# Patient Record
Sex: Male | Born: 1965 | Race: White | Hispanic: No | Marital: Married | Smoking: Never smoker
Health system: Southern US, Community
[De-identification: ages and names within clinical notes are randomized; demographics above are authoritative.]

## PROBLEM LIST (undated history)

## (undated) DIAGNOSIS — I10 Essential (primary) hypertension: Secondary | ICD-10-CM

## (undated) DIAGNOSIS — J302 Other seasonal allergic rhinitis: Secondary | ICD-10-CM

## (undated) DIAGNOSIS — H269 Unspecified cataract: Secondary | ICD-10-CM

## (undated) DIAGNOSIS — E119 Type 2 diabetes mellitus without complications: Secondary | ICD-10-CM

## (undated) DIAGNOSIS — Z973 Presence of spectacles and contact lenses: Secondary | ICD-10-CM

## (undated) DIAGNOSIS — T7840XA Allergy, unspecified, initial encounter: Secondary | ICD-10-CM

## (undated) HISTORY — PX: COLONOSCOPY: SHX174

## (undated) HISTORY — DX: Unspecified cataract: H26.9

## (undated) HISTORY — PX: TONSILLECTOMY: SUR1361

## (undated) HISTORY — DX: Allergy, unspecified, initial encounter: T78.40XA

## (undated) HISTORY — DX: Type 2 diabetes mellitus without complications: E11.9

---

## 1998-10-25 HISTORY — PX: HERNIA REPAIR: SHX51

## 2006-10-04 ENCOUNTER — Ambulatory Visit: Payer: Self-pay | Admitting: Internal Medicine

## 2006-10-14 ENCOUNTER — Ambulatory Visit: Payer: Self-pay | Admitting: Internal Medicine

## 2006-10-14 LAB — CONVERTED CEMR LAB
ALT: 27 units/L (ref 0–40)
Alkaline Phosphatase: 66 units/L (ref 39–117)
Basophils Relative: 0.7 % (ref 0.0–1.0)
CO2: 27 meq/L (ref 19–32)
Calcium: 9.3 mg/dL (ref 8.4–10.5)
Chloride: 105 meq/L (ref 96–112)
Eosinophil percent: 1.4 % (ref 0.0–5.0)
Glomerular Filtration Rate, Af Am: 120 mL/min/{1.73_m2}
Glucose, Bld: 119 mg/dL — ABNORMAL HIGH (ref 70–99)
HCT: 41.7 % (ref 39.0–52.0)
HDL: 38.4 mg/dL — ABNORMAL LOW (ref >39.0)
Hemoglobin: 14 g/dL (ref 13.0–17.0)
Hgb A1c MFr Bld: 6.2 % — ABNORMAL HIGH (ref 4.6–6.0)
LDL Cholesterol: 122 mg/dL — ABNORMAL HIGH (ref 0–99)
Microalb Creat Ratio: 12.6 mg/g (ref 0.0–30.0)
Monocytes Absolute: 0.8 10*3/uL — ABNORMAL HIGH (ref 0.2–0.7)
Monocytes Relative: 8.4 % (ref 3.0–11.0)
RDW: 13.3 % (ref 11.5–14.6)
VLDL: 31 mg/dL (ref 0–40)
WBC: 9.3 10*3/uL (ref 4.5–10.5)

## 2006-10-27 ENCOUNTER — Ambulatory Visit: Payer: Self-pay | Admitting: Internal Medicine

## 2006-11-20 ENCOUNTER — Emergency Department (HOSPITAL_COMMUNITY): Admission: EM | Admit: 2006-11-20 | Discharge: 2006-11-20 | Payer: Self-pay | Admitting: Family Medicine

## 2007-02-15 ENCOUNTER — Ambulatory Visit: Payer: Self-pay | Admitting: Internal Medicine

## 2007-07-17 ENCOUNTER — Ambulatory Visit: Payer: Self-pay | Admitting: Internal Medicine

## 2007-07-18 ENCOUNTER — Ambulatory Visit: Payer: Self-pay | Admitting: Internal Medicine

## 2007-07-18 LAB — CONVERTED CEMR LAB
ALT: 23 units/L (ref 0–53)
BUN: 12 mg/dL (ref 6–23)
Chloride: 106 meq/L (ref 96–112)
Creatinine, Ser: 0.9 mg/dL (ref 0.4–1.5)
Creatinine,U: 204.3 mg/dL
Hgb A1c MFr Bld: 6.3 % — ABNORMAL HIGH (ref 4.6–6.0)
Microalb Creat Ratio: 13.2 mg/g (ref 0.0–30.0)
Microalb, Ur: 2.7 mg/dL — ABNORMAL HIGH (ref 0.0–1.9)
VLDL: 35 mg/dL (ref 0–40)

## 2008-01-15 DIAGNOSIS — Z8719 Personal history of other diseases of the digestive system: Secondary | ICD-10-CM | POA: Insufficient documentation

## 2008-01-15 DIAGNOSIS — J301 Allergic rhinitis due to pollen: Secondary | ICD-10-CM

## 2008-01-15 DIAGNOSIS — E119 Type 2 diabetes mellitus without complications: Secondary | ICD-10-CM

## 2008-01-15 DIAGNOSIS — Z8679 Personal history of other diseases of the circulatory system: Secondary | ICD-10-CM | POA: Insufficient documentation

## 2008-01-15 HISTORY — DX: Type 2 diabetes mellitus without complications: E11.9

## 2008-01-16 ENCOUNTER — Ambulatory Visit: Payer: Self-pay | Admitting: Internal Medicine

## 2008-01-16 DIAGNOSIS — E785 Hyperlipidemia, unspecified: Secondary | ICD-10-CM | POA: Insufficient documentation

## 2008-01-16 DIAGNOSIS — J309 Allergic rhinitis, unspecified: Secondary | ICD-10-CM | POA: Insufficient documentation

## 2008-01-16 LAB — CONVERTED CEMR LAB
Calcium: 9.3 mg/dL (ref 8.4–10.5)
Chloride: 105 meq/L (ref 96–112)
Direct LDL: 119.8 mg/dL
GFR calc Af Amer: 136 mL/min
Microalb, Ur: 2.1 mg/dL — ABNORMAL HIGH (ref 0.0–1.9)
Potassium: 4.4 meq/L (ref 3.5–5.1)
Total CHOL/HDL Ratio: 6.4
VLDL: 44 mg/dL — ABNORMAL HIGH (ref 0–40)

## 2008-01-17 ENCOUNTER — Telehealth: Payer: Self-pay | Admitting: Internal Medicine

## 2008-12-13 ENCOUNTER — Encounter: Payer: Self-pay | Admitting: Internal Medicine

## 2010-01-08 ENCOUNTER — Encounter: Payer: Self-pay | Admitting: Internal Medicine

## 2010-01-13 ENCOUNTER — Encounter: Payer: Self-pay | Admitting: Internal Medicine

## 2010-11-24 NOTE — Letter (Signed)
Summary: [facility]  [facility]   Imported By: Lanelle Bal 01/27/2010 12:58:30  _____________________________________________________________________  External Attachment:    Type:   Image     Comment:   External Document

## 2010-11-24 NOTE — Miscellaneous (Signed)
Summary: Eye Exam   Clinical Lists Changes  Observations: Added new observation of DMEYEEXAMNXT: 01/2011 (01/13/2010 9:31) Added new observation of DMEYEEXMRES: normal (01/08/2010 9:31) Added new observation of EYE EXAM BY: [facility] [phone] (01/08/2010 9:31) Added new observation of DIAB EYE EX: normal (01/08/2010 9:31)       Diabetes Management Exam:    Eye Exam:       Eye Exam done elsewhere          Date: 01/08/2010          Results: normal          Done by: [facility] [phone]

## 2011-01-14 LAB — HM DIABETES EYE EXAM

## 2011-01-18 ENCOUNTER — Other Ambulatory Visit: Payer: Self-pay | Admitting: *Deleted

## 2011-03-12 NOTE — Assessment & Plan Note (Signed)
[facility]                           PRIMARY CARE OFFICE NOTE   Timothy Lasso ANTRELL TIPLER                 MRN:          [id number]  DATE:10/04/2006                            DOB:          12-Jun-1966    CHIEF COMPLAINT:  New patient to practice.   HISTORY OF PRESENT ILLNESS:  The patient is a 45 year old white male  here to establish primary care.  The patient is originally from this  area but moved to [place] and has moved back to [place]  approximately 1 year ago.  He has been diagnosed with type 2 diabetes in  the year 2002.  At that time he states he weighed greater than 290  pounds and had very poor eating habits/diet.  Since then, the patient  has dramatically changed his lifestyle.  He was initially put on  metformin and also an antihypertensive but has been able to get off  those medications and has controlled his medications through diet alone.  His last A1c was performed a little over a year ago and he states it was  less than 6.   His other history is significant for repair of an umbilical hernia 2000,  tonsils in the late 1970s.  He did have some issues with frequent stools  that were blood tinged and a colonoscopy was performed in spring of  1999.  They did not find any abnormalities.   PAST MEDICAL HISTORY SUMMARY:  1. Type 2 diabetes.  2. History of hypertension.  3. Status post umbilical hernia repair June 2000.  4. History of rectal bleeding with normal colonoscopy in 1999.  5. Hay fever/allergies.   CURRENT MEDICATIONS:  None.   ALLERGIES TO MEDICATIONS:  None known.   SOCIAL HISTORY:  The patient is married, has two children ages 40 and 88.  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   FAMILY HISTORY:  Mother and father are both alive at age 11.  Mother has  end-stage COPD secondary to smoking/tobacco abuse.  Father is known to  have prostate cancer in remission.  No family history of type  2  diabetes, early heart disease.  Paternal grandfather is noted to have  pancreatic cancer.   HABITS:  He does not drink or smoke.   REVIEW OF SYSTEMS:  No fevers or chills.  No HEENT symptoms.  He  routinely climbs three flights of stairs per day with no associated  chest heaviness or shortness of breath.  Denies cough.  No heartburn,  nausea, vomiting, constipation, diarrhea.  No dark stools or blood in  his stool.  All other systems negative.  Please also note that he has  had an eye exam in December 2006.  No signs of diabetes.   PHYSICAL EXAMINATION:  VITAL SIGNS:  Height is 6 feet 1.5 inches, weight  is 262 pounds, temperature is 97.6, pulse is 86, and BP is 142/70 in the  left arm in a seated position with a manual cuff.  GENERAL:  The patient is a pleasant 45 year old overweight white male in  no apparent distress.  HEENT:  Normocephalic, atraumatic.  Pupils  are equal and reactive to  light bilaterally.  Extraocular motility was intact.  The patient was  anicteric.  Conjunctivae was within normal limits.  External auditory  canals and tympanic membranes were clear bilaterally.  Hearing was  grossly normal.  Oropharyngeal exam revealed mild cobblestoning,  otherwise negative.  NECK:  Supple.  No adenopathy, carotid bruit, or thyromegaly.  CHEST:  Normal expiratory effort.  Chest was clear to auscultation  bilaterally.  No rhonchi, rales or wheezing.  CARDIOVASCULAR:  Regular rate and rhythm.  No significant murmurs, rubs,  or gallops appreciated.  ABDOMEN:  Slightly protuberant, nontender, positive bowel sounds, no  organomegaly.  MUSCULOSKELETAL:  No clubbing, cyanosis, or edema.  He had intact pedis  dorsalis pulses.  NEUROLOGIC:  Cranial nerves II-XII were grossly intact, nonfocal.   IMPRESSION/RECOMMENDATION:  1. Type 2 diabetes, diet controlled.  2. History of hypertension, suboptimal.  3. History of umbilical hernia repair.  4. Health maintenance.    RECOMMENDATIONS:  The patient will be sent for followup labs, checking  his A1c, microalbumin/creatinine ratio.  Also, we will check his lipid  status and we  discussed in the setting of diabetes aggressive lipid  management if needed.   If A1c is suboptimal we discussed restarting metformin therapy.  It did  help with appetite control.  In general, we discussed the goal of losing  an additional 20-25 pounds over the next 6 months to a year.  Followup  time is in approximately 1 month.     Barbette Hair. Artist Pais, DO  Electronically Signed    RDY/MedQ  DD: 10/04/2006  DT: 10/04/2006  Job #: [id number]

## 2012-02-02 ENCOUNTER — Encounter: Payer: Self-pay | Admitting: Internal Medicine

## 2012-05-06 ENCOUNTER — Ambulatory Visit: Payer: Self-pay

## 2012-05-09 ENCOUNTER — Encounter: Payer: 59 | Attending: Internal Medicine | Admitting: *Deleted

## 2012-05-09 VITALS — Ht 73.0 in | Wt 260.0 lb

## 2012-05-09 DIAGNOSIS — E119 Type 2 diabetes mellitus without complications: Secondary | ICD-10-CM

## 2012-05-11 ENCOUNTER — Encounter: Payer: Self-pay | Admitting: *Deleted

## 2012-05-11 NOTE — Progress Notes (Signed)
  Patient was seen on 05/09/2012 for the first of a series of three diabetes self-management courses at the [facility].  Current A1c = 6.3% on 5/13 The following learning objectives were met by the patient during this course:   Defines the role of glucose and insulin  Identifies type of diabetes and pathophysiology  Defines the diagnostic criteria for diabetes and prediabetes  States the risk factors for Type 2 Diabetes  States the symptoms of Type 2 Diabetes  Defines Type 2 Diabetes treatment goals  Defines Type 2 Diabetes treatment options  States the rationale for glucose monitoring  Identifies A1C, glucose targets, and testing times  Identifies proper sharps disposal  Defines the purpose of a diabetes food plan  Identifies carbohydrate food groups  Defines effects of carbohydrate foods on glucose levels  Identifies carbohydrate choices/grams/food labels  States benefits of physical activity and effect on glucose  Review of suggested activity guidelines  Handouts given during class include:  Type 2 Diabetes: Basics Book  My Food Plan Book  Food and Activity Log  Follow-Up Plan: Core Class 2

## 2012-05-11 NOTE — Patient Instructions (Signed)
Goals:  Follow Diabetes Meal Plan as instructed  Eat 3 meals and 2 snacks, every 3-5 hrs  Limit carbohydrate intake to 45-60 grams carbohydrate/meal  Limit carbohydrate intake to 0-30 grams carbohydrate/snack  Add lean protein foods to meals/snacks  Monitor glucose levels as instructed by your doctor  Aim for 15-30 mins of physical activity daily  Bring food record and glucose log to your next nutrition visit   

## 2012-06-06 ENCOUNTER — Ambulatory Visit: Payer: 59

## 2012-06-10 ENCOUNTER — Encounter: Payer: 59 | Attending: Internal Medicine | Admitting: *Deleted

## 2012-06-10 ENCOUNTER — Encounter: Payer: Self-pay | Admitting: *Deleted

## 2012-06-10 NOTE — Progress Notes (Signed)
Patient was seen on 06/10/12 for the second part of a three-part diabetes self-management courses at the [facility].   The following learning objectives were met by the patient during this course  Core 2:  Describe causes, symptoms and treatment of hypoglycemia and hyperglycemia  Learn how to care for your glucose meter and strips  Explain how to manage diabetes during illness  List strategies to follow meal plan when dining out  Describe the effects of alcohol on glucose and how to use it safely  Describe problem solving skills for day-to-day glucose challenges  Describe ways to remain physically active  Describe the impact of regular activity on insulin resistance  Handouts given in this class:  Refrigerator magnet for Sick Day Guidelines  [facility] Oral Medication and Insulin handout  Core 3  Describe how diabetes changes over time  Understand why glucose may be out of target  Learn how diabetes changes over time  Learn about blood pressure, cholesterol, and heart health  Learn about lowering dietary fat and sodium  Understand the benefits of physical activity for heart health  Develop problem solving skills for times when glucose numbers are puzzling  Develop strategies for creating life balance  Learn how to identify if your treatment plan needs to change  Develop strategies for dealing with stress, depression and staying motivated  Identify healthy weight-loss plans  Gain confidence that you can succeed in caring for your diabetes  Establish 2-3 goals that they will plan to diligently work on until they return                   for the free 47-month follow-up visit   The following handouts were given in class:  3 Month Follow Up Visit handout  Goal setting handout  Class evaluation form  Your patient has established the following 3 month goals for diabetes self-care:  Increase physical activity at least 3  days/week  Take diabetes medications as scheduled  Maintain regularly scheduled visits with PCP  Follow-Up Plan: Patient was offered a 3 month follow-up visit for diabetes self-management education.

## 2012-06-10 NOTE — Patient Instructions (Signed)
Goals:  Follow Diabetes Meal Plan as instructed  Eat 3 meals and 2 snacks, every 3-5 hrs  Limit carbohydrate intake to 45-60 grams carbohydrate/meal  Limit carbohydrate intake to 15-30 grams carbohydrate/snack  Add lean protein foods to meals/snacks  Monitor glucose levels as instructed by your doctor  Aim for 30 mins of physical activity 3 days/week

## 2012-06-27 ENCOUNTER — Ambulatory Visit: Payer: 59

## 2012-07-11 ENCOUNTER — Ambulatory Visit: Payer: 59

## 2012-12-21 ENCOUNTER — Ambulatory Visit (INDEPENDENT_AMBULATORY_CARE_PROVIDER_SITE_OTHER): Payer: Self-pay | Admitting: Family Medicine

## 2012-12-21 DIAGNOSIS — E119 Type 2 diabetes mellitus without complications: Secondary | ICD-10-CM

## 2012-12-21 NOTE — Progress Notes (Signed)
Patient presents for 3 month follow up of DM as part of the employee sponsored Link to [employer]. Medications have been reviewed. I have also discussed with patient lifestyle interventions such as diet and exercise. Full documentation of this visit can be seen in the [employer] program through [employer] [facility]). Patient has set a series of personal goals and will follow up in 3 months for further review of DM.

## 2013-01-15 NOTE — Progress Notes (Signed)
Patient ID: Edu On, male   DOB: 12/08/1965, 47 y.o.   MRN: [id number] ATTENDING PHYSICIAN NOTE: I have reviewed the chart and agree with the plan as detailed above. Denny Levy MD Pager [phone]

## 2013-03-22 ENCOUNTER — Ambulatory Visit (INDEPENDENT_AMBULATORY_CARE_PROVIDER_SITE_OTHER): Payer: Self-pay | Admitting: Family Medicine

## 2013-03-22 DIAGNOSIS — E119 Type 2 diabetes mellitus without complications: Secondary | ICD-10-CM

## 2013-03-22 NOTE — Progress Notes (Signed)
Patient presents for 3 month follow up DM. Medications and glucose readings have been reviewed. I have also discussed with patient lifestyle interventions such as diet and exercise. Full documentation of this visit can be found in the [employer] documenting system through [employer] [facility]). Patient has set a series of personal goals and will follow up in 3 months for further review of DM.

## 2013-04-16 NOTE — Progress Notes (Signed)
Patient ID: Andrew Edwards, male   DOB: 03/19/1966, 47 y.o.   MRN: [id number] ATTENDING PHYSICIAN NOTE: I have reviewed the chart and agree with the plan as detailed above. Adaleen Hulgan MD Pager [phone]  

## 2013-06-21 ENCOUNTER — Ambulatory Visit (INDEPENDENT_AMBULATORY_CARE_PROVIDER_SITE_OTHER): Payer: 59 | Admitting: Family Medicine

## 2013-06-21 VITALS — BP 126/81 | HR 71 | Wt 254.0 lb

## 2013-06-21 DIAGNOSIS — E119 Type 2 diabetes mellitus without complications: Secondary | ICD-10-CM

## 2013-06-21 NOTE — Progress Notes (Signed)
Patient presents for 3 month f/u DM as part of the employee sponsored Link to [employer]. Medications have been reviewed. I have also discussed with patient lifestyle interventions such as diet and exercise. Full documentation of this visit can be found in the [employer] documenting system through [employer] [facility]). However specifics from this visit include the following:  Other POC A1C 5.8, at goal on metformin. No recommended changes.   plan 1.) increase exercise to 5 days/week for 30 min. Goal 150 mins/week 2.) Find new pcp since current pcp left practice. Check to see if have ever received pneumovax.  3.) Hyperlipidemia: has not had cholesterol checked. When he finds a new PCP, this needs to checked and he needs a statin regardless because he's a diabetic between 3-75  Patient has set a series of personal goals and will f/u in 3 months for further review of DM

## 2013-08-22 NOTE — Progress Notes (Signed)
Patient ID: Andrew Edwards, male   DOB: 03/24/1966, 47 y.o.   MRN: [id number] ATTENDING PHYSICIAN NOTE: I have reviewed the chart and agree with the plan as detailed above. Faizah Kandler MD Pager [phone]  

## 2013-09-27 ENCOUNTER — Ambulatory Visit (INDEPENDENT_AMBULATORY_CARE_PROVIDER_SITE_OTHER): Payer: Self-pay | Admitting: Family Medicine

## 2013-09-27 VITALS — BP 132/94 | HR 66 | Wt 254.0 lb

## 2013-09-27 DIAGNOSIS — E119 Type 2 diabetes mellitus without complications: Secondary | ICD-10-CM

## 2013-09-27 NOTE — Progress Notes (Signed)
Patient presents for 3 month follow up DM as part of the employee sponsored Link to [employer]. Medications have been reviewed. I have also discussed with patient lifestyle interventions such as diet and exercise. Full documentation of this visit can be found in the Care tracker documenting system through [facility]). However specifics from this visit include the following:  Diabetes Mellitus: POC A1C 5.8, at goal on metformin. No recommended changes. Not testing blood sugar but I told him it's fine since he's just on metformin but to check if he ever feels funny or shaky.  plan 1.) use [employer] to track calories 2.) increase gym usage from 1-2x/week to 3x/week 3.) goal wt 245 lb 4.) will move to 6 mo f/u  Hypertension:  not on any medication but diastolic was a little high today. He is stressed with job (audit time). counseled to check while he's at the [facility] before he exercise. will monitor. HLD: patient is a diabetic btwn the age of 54-75. Needs to be on a moderate intensity statin. Patient will discuss with MD at next visit when he receives his physical.   Patient has set a series of personal goals and will follow up in 6 months for further review of DM

## 2013-11-14 NOTE — Progress Notes (Signed)
Patient ID: Andrew Edwards, male   DOB: [birth year], 48 y.o.   MRN: [id number] ATTENDING PHYSICIAN NOTE: I have reviewed the chart and agree with the plan as detailed above. Dorcas Mcmurray MD Pager [phone]

## 2014-04-16 ENCOUNTER — Ambulatory Visit (INDEPENDENT_AMBULATORY_CARE_PROVIDER_SITE_OTHER): Payer: Self-pay | Admitting: Family Medicine

## 2014-04-16 DIAGNOSIS — E119 Type 2 diabetes mellitus without complications: Secondary | ICD-10-CM

## 2014-04-16 NOTE — Progress Notes (Signed)
Patient presents for 6 mo f/u DM as part of the employee sponsored Link to [employer]. Medications have been reviewed. I have also discussed with patient lifestyle interventions such as diet and exercise. Full documentation of this visit can be found in the caretracker documenting system through [facility]). However specifics of this visit include the following:  Diabetes Mellitus: POC A1C 5.6,at goal on metformin bid. Patient is having annual labs drawn soon, asked him to bring me a copy. Exercising 2-3x/week for 30 mins. plan 1.) try to increase exercise to 150 mins/ week 2.) check with PCP about getting PNA shot 3.) f/u 6 mo    Patient has set a series of personal goals and will f/u in 6 mo for further review of DM

## 2014-04-29 ENCOUNTER — Other Ambulatory Visit: Payer: Self-pay | Admitting: Otolaryngology

## 2014-04-29 DIAGNOSIS — J329 Chronic sinusitis, unspecified: Secondary | ICD-10-CM

## 2014-04-29 DIAGNOSIS — J3489 Other specified disorders of nose and nasal sinuses: Secondary | ICD-10-CM

## 2014-05-16 NOTE — Progress Notes (Signed)
Patient ID: Andrew Edwards, male   DOB: 08/15/1966, 48 y.o.   MRN: [id number] ATTENDING PHYSICIAN NOTE: I have reviewed the chart and agree with the plan as detailed above. Dorcas Mcmurray MD Pager [phone]

## 2014-05-17 ENCOUNTER — Ambulatory Visit
Admission: RE | Admit: 2014-05-17 | Discharge: 2014-05-17 | Disposition: A | Discharge status: Home / Self Care | Payer: 59 | Source: Ambulatory Visit | Attending: Otolaryngology | Admitting: Otolaryngology

## 2014-05-17 DIAGNOSIS — J329 Chronic sinusitis, unspecified: Secondary | ICD-10-CM

## 2014-05-17 DIAGNOSIS — J3489 Other specified disorders of nose and nasal sinuses: Secondary | ICD-10-CM

## 2014-05-22 ENCOUNTER — Other Ambulatory Visit: Payer: Self-pay | Admitting: Family Medicine

## 2014-05-22 DIAGNOSIS — Z0000 Encounter for general adult medical examination without abnormal findings: Secondary | ICD-10-CM

## 2014-05-29 ENCOUNTER — Ambulatory Visit
Admission: RE | Admit: 2014-05-29 | Discharge: 2014-05-29 | Disposition: A | Discharge status: Home / Self Care | Payer: 59 | Source: Ambulatory Visit | Attending: Family Medicine | Admitting: Family Medicine

## 2014-05-29 DIAGNOSIS — Z0000 Encounter for general adult medical examination without abnormal findings: Secondary | ICD-10-CM

## 2014-10-15 ENCOUNTER — Ambulatory Visit (INDEPENDENT_AMBULATORY_CARE_PROVIDER_SITE_OTHER): Payer: Self-pay | Admitting: Family Medicine

## 2014-10-15 VITALS — BP 142/75 | HR 77 | Wt 256.0 lb

## 2014-10-15 DIAGNOSIS — E119 Type 2 diabetes mellitus without complications: Secondary | ICD-10-CM

## 2014-10-15 NOTE — Progress Notes (Signed)
Patient presents for 3 mo f/u DM as part of the employee sponsored Link to [employer]. Medications have been reviewed. I have also discussed with patient lifestyle interventions such as diet and exercise. Full documentation of this visit can be found in the [employer] documenting system through [facility]). However, specifics from this visit include the following:  Diabetes Mellitus: Other  POC A1C at goal, no recommended medication changes. plan 1.) patient will work on exercising. Even though A1C is at goal, it has risen from 5.6 to to 6.2.  2.) f/u 6 mo Hypertension: elevated today but not usually high. Patient attributes to stress at work. Will monitor and contact me if continues to run high. Reviewed/counseled on blood pressure target.  Patient has set a series of personal goals and will f/u in 6 mo for further review of DM

## 2014-11-07 NOTE — Progress Notes (Signed)
Patient ID: Andrew Edwards, male   DOB: [birth year], 49 y.o.   MRN: [id number] Reviewed: Agree with the documentation and management of our [place].

## 2014-12-13 ENCOUNTER — Encounter (HOSPITAL_BASED_OUTPATIENT_CLINIC_OR_DEPARTMENT_OTHER): Payer: Self-pay | Admitting: *Deleted

## 2014-12-13 NOTE — Progress Notes (Signed)
Pt works [employer]-will come in for ekg-bme tlabs  Done pcp-only hgb a1c

## 2014-12-16 ENCOUNTER — Other Ambulatory Visit: Payer: Self-pay

## 2014-12-16 ENCOUNTER — Encounter (HOSPITAL_BASED_OUTPATIENT_CLINIC_OR_DEPARTMENT_OTHER)
Admission: RE | Admit: 2014-12-16 | Discharge: 2014-12-16 | Disposition: A | Discharge status: Home / Self Care | Payer: 59 | Source: Ambulatory Visit | Attending: Otolaryngology | Admitting: Otolaryngology

## 2014-12-16 DIAGNOSIS — E119 Type 2 diabetes mellitus without complications: Secondary | ICD-10-CM | POA: Diagnosis not present

## 2014-12-16 DIAGNOSIS — Z79899 Other long term (current) drug therapy: Secondary | ICD-10-CM | POA: Diagnosis not present

## 2014-12-16 DIAGNOSIS — J342 Deviated nasal septum: Secondary | ICD-10-CM | POA: Diagnosis not present

## 2014-12-16 DIAGNOSIS — J343 Hypertrophy of nasal turbinates: Secondary | ICD-10-CM | POA: Diagnosis not present

## 2014-12-16 LAB — BASIC METABOLIC PANEL
Anion gap: 8 (ref 5–15)
BUN: 14 mg/dL (ref 6–23)
CALCIUM: 9.4 mg/dL (ref 8.4–10.5)
CO2: 26 mmol/L (ref 19–32)
CREATININE: 0.94 mg/dL (ref 0.50–1.35)
Chloride: 105 mmol/L (ref 96–112)
GFR calc Af Amer: >90 mL/min (ref >90)
GFR calc non Af Amer: >90 mL/min (ref >90)
GLUCOSE: 162 mg/dL — AB (ref 70–99)
Potassium: 3.9 mmol/L (ref 3.5–5.1)
SODIUM: 139 mmol/L (ref 135–145)

## 2014-12-19 ENCOUNTER — Ambulatory Visit (HOSPITAL_BASED_OUTPATIENT_CLINIC_OR_DEPARTMENT_OTHER): Payer: 59 | Admitting: Certified Registered"

## 2014-12-19 ENCOUNTER — Encounter (HOSPITAL_BASED_OUTPATIENT_CLINIC_OR_DEPARTMENT_OTHER)
Admission: RE | Disposition: A | Discharge status: Home / Self Care | Payer: Self-pay | Source: Ambulatory Visit | Attending: Otolaryngology

## 2014-12-19 ENCOUNTER — Ambulatory Visit (HOSPITAL_BASED_OUTPATIENT_CLINIC_OR_DEPARTMENT_OTHER)
Admission: RE | Admit: 2014-12-19 | Discharge: 2014-12-19 | Disposition: A | Discharge status: Home / Self Care | Payer: 59 | Source: Ambulatory Visit | Attending: Otolaryngology | Admitting: Otolaryngology

## 2014-12-19 ENCOUNTER — Encounter (HOSPITAL_BASED_OUTPATIENT_CLINIC_OR_DEPARTMENT_OTHER): Payer: Self-pay | Admitting: Certified Registered"

## 2014-12-19 DIAGNOSIS — J343 Hypertrophy of nasal turbinates: Secondary | ICD-10-CM | POA: Insufficient documentation

## 2014-12-19 DIAGNOSIS — E119 Type 2 diabetes mellitus without complications: Secondary | ICD-10-CM | POA: Insufficient documentation

## 2014-12-19 DIAGNOSIS — J342 Deviated nasal septum: Secondary | ICD-10-CM | POA: Insufficient documentation

## 2014-12-19 DIAGNOSIS — Z79899 Other long term (current) drug therapy: Secondary | ICD-10-CM | POA: Insufficient documentation

## 2014-12-19 HISTORY — DX: Presence of spectacles and contact lenses: Z97.3

## 2014-12-19 HISTORY — PX: NASAL SEPTOPLASTY W/ TURBINOPLASTY: SHX2070

## 2014-12-19 HISTORY — DX: Other seasonal allergic rhinitis: J30.2

## 2014-12-19 LAB — GLUCOSE, CAPILLARY
GLUCOSE-CAPILLARY: 116 mg/dL — AB (ref 70–99)
GLUCOSE-CAPILLARY: 126 mg/dL — AB (ref 70–99)

## 2014-12-19 SURGERY — SEPTOPLASTY, NOSE, WITH NASAL TURBINATE REDUCTION
Anesthesia: General | Site: Nose | Laterality: Bilateral

## 2014-12-19 MED ORDER — BACITRACIN ZINC 500 UNIT/GM EX OINT
TOPICAL_OINTMENT | CUTANEOUS | Status: AC
  Filled 2014-12-19: qty 28.35

## 2014-12-19 MED ORDER — FENTANYL CITRATE 0.05 MG/ML IJ SOLN
25.0000 ug | INTRAMUSCULAR | Status: DC | PRN
  Administered 2014-12-19: 50 ug via INTRAVENOUS
  Administered 2014-12-19 (×2): 25 ug via INTRAVENOUS

## 2014-12-19 MED ORDER — LIDOCAINE HCL (CARDIAC) 20 MG/ML IV SOLN
INTRAVENOUS | Status: DC | PRN
  Administered 2014-12-19: 60 mg via INTRAVENOUS

## 2014-12-19 MED ORDER — MIDAZOLAM HCL 2 MG/2ML IJ SOLN: 1.0000 mg | INTRAMUSCULAR | Status: DC | PRN

## 2014-12-19 MED ORDER — BACITRACIN ZINC 500 UNIT/GM EX OINT
TOPICAL_OINTMENT | CUTANEOUS | Status: DC | PRN
  Administered 2014-12-19: 1 via TOPICAL

## 2014-12-19 MED ORDER — FENTANYL CITRATE 0.05 MG/ML IJ SOLN: 50.0000 ug | INTRAMUSCULAR | Status: DC | PRN

## 2014-12-19 MED ORDER — CEFAZOLIN SODIUM-DEXTROSE 2-3 GM-% IV SOLR
INTRAVENOUS | Status: DC | PRN
  Administered 2014-12-19: 2 g via INTRAVENOUS

## 2014-12-19 MED ORDER — CEFAZOLIN SODIUM-DEXTROSE 2-3 GM-% IV SOLR
INTRAVENOUS | Status: AC
  Filled 2014-12-19: qty 50

## 2014-12-19 MED ORDER — SUCCINYLCHOLINE CHLORIDE 20 MG/ML IJ SOLN
INTRAMUSCULAR | Status: DC | PRN
  Administered 2014-12-19: 100 mg via INTRAVENOUS

## 2014-12-19 MED ORDER — OXYMETAZOLINE HCL 0.05 % NA SOLN
NASAL | Status: DC | PRN
  Administered 2014-12-19: 1 via NASAL

## 2014-12-19 MED ORDER — LIDOCAINE-EPINEPHRINE 1 %-1:100000 IJ SOLN
INTRAMUSCULAR | Status: AC
  Filled 2014-12-19: qty 1

## 2014-12-19 MED ORDER — ONDANSETRON HCL 4 MG/2ML IJ SOLN
INTRAMUSCULAR | Status: DC | PRN
  Administered 2014-12-19: 4 mg via INTRAVENOUS

## 2014-12-19 MED ORDER — FENTANYL CITRATE 0.05 MG/ML IJ SOLN
INTRAMUSCULAR | Status: AC
  Filled 2014-12-19: qty 2

## 2014-12-19 MED ORDER — MIDAZOLAM HCL 5 MG/5ML IJ SOLN
INTRAMUSCULAR | Status: DC | PRN
  Administered 2014-12-19: 2 mg via INTRAVENOUS

## 2014-12-19 MED ORDER — HYDROCODONE-ACETAMINOPHEN 5-325 MG PO TABS: 1.0000 | ORAL_TABLET | Freq: Four times a day (QID) | ORAL | Status: DC | PRN

## 2014-12-19 MED ORDER — PROPOFOL 10 MG/ML IV BOLUS
INTRAVENOUS | Status: DC | PRN
  Administered 2014-12-19: 200 mg via INTRAVENOUS

## 2014-12-19 MED ORDER — FENTANYL CITRATE 0.05 MG/ML IJ SOLN
INTRAMUSCULAR | Status: AC
Start: 2014-12-19 — End: ?
  Filled 2014-12-19: qty 6

## 2014-12-19 MED ORDER — ACETAMINOPHEN 10 MG/ML IV SOLN
INTRAVENOUS | Status: DC | PRN
  Administered 2014-12-19: 1000 mg via INTRAVENOUS

## 2014-12-19 MED ORDER — PROPOFOL 10 MG/ML IV EMUL
INTRAVENOUS | Status: AC
  Filled 2014-12-19: qty 50

## 2014-12-19 MED ORDER — PROMETHAZINE HCL 25 MG/ML IJ SOLN: 6.2500 mg | INTRAMUSCULAR | Status: DC | PRN

## 2014-12-19 MED ORDER — OXYMETAZOLINE HCL 0.05 % NA SOLN
NASAL | Status: AC
  Filled 2014-12-19: qty 15

## 2014-12-19 MED ORDER — FENTANYL CITRATE 0.05 MG/ML IJ SOLN
INTRAMUSCULAR | Status: DC | PRN
  Administered 2014-12-19: 50 ug via INTRAVENOUS

## 2014-12-19 MED ORDER — LIDOCAINE-EPINEPHRINE 1 %-1:100000 IJ SOLN
INTRAMUSCULAR | Status: DC | PRN
  Administered 2014-12-19: 9 mL

## 2014-12-19 MED ORDER — MIDAZOLAM HCL 2 MG/2ML IJ SOLN
INTRAMUSCULAR | Status: AC
  Filled 2014-12-19: qty 2

## 2014-12-19 MED ORDER — LACTATED RINGERS IV SOLN
INTRAVENOUS | Status: DC
  Administered 2014-12-19 (×3): via INTRAVENOUS

## 2014-12-19 MED ORDER — DEXAMETHASONE SODIUM PHOSPHATE 4 MG/ML IJ SOLN
INTRAMUSCULAR | Status: DC | PRN
  Administered 2014-12-19: 10 mg via INTRAVENOUS

## 2014-12-19 MED ORDER — SODIUM CHLORIDE 0.9 % IV SOLN
INTRAVENOUS | Status: DC | PRN
  Administered 2014-12-19: 200 mL via INTRAMUSCULAR

## 2014-12-19 MED ORDER — SUCCINYLCHOLINE CHLORIDE 20 MG/ML IJ SOLN
INTRAMUSCULAR | Status: AC
  Filled 2014-12-19: qty 1

## 2014-12-19 MED ORDER — CEPHALEXIN 500 MG PO CAPS: 500.0000 mg | ORAL_CAPSULE | Freq: Two times a day (BID) | ORAL | Status: AC

## 2014-12-19 SURGICAL SUPPLY — 42 items
ATTRACTOMAT 16X20 MAGNETIC DRP (DRAPES) IMPLANT
BLADE INF TURB ROT M4 2 5PK (BLADE) ×2 IMPLANT
CANISTER SUCT 1200ML W/VALVE (MISCELLANEOUS) ×2 IMPLANT
COAGULATOR SUCT 8FR VV (MISCELLANEOUS) IMPLANT
DECANTER SPIKE VIAL GLASS SM (MISCELLANEOUS) IMPLANT
DEPRESSOR TONGUE BLADE STERILE (MISCELLANEOUS) ×2 IMPLANT
DRSG NASOPORE 8CM (GAUZE/BANDAGES/DRESSINGS) IMPLANT
DRSG TELFA 3X8 NADH (GAUZE/BANDAGES/DRESSINGS) IMPLANT
ELECT REM PT RETURN 9FT ADLT (ELECTROSURGICAL) ×2
ELECTRODE REM PT RTRN 9FT ADLT (ELECTROSURGICAL) ×1 IMPLANT
GLOVE BIOGEL PI IND STRL 7.0 (GLOVE) ×2 IMPLANT
GLOVE BIOGEL PI IND STRL 7.5 (GLOVE) ×1 IMPLANT
GLOVE BIOGEL PI INDICATOR 7.0 (GLOVE) ×2
GLOVE BIOGEL PI INDICATOR 7.5 (GLOVE) ×1
GLOVE ECLIPSE 6.5 STRL STRAW (GLOVE) ×2 IMPLANT
GLOVE SS BIOGEL STRL SZ 7.5 (GLOVE) ×1 IMPLANT
GLOVE SUPERSENSE BIOGEL SZ 7.5 (GLOVE) ×1
GLOVE SURG SS PI 7.5 STRL IVOR (GLOVE) ×2 IMPLANT
GOWN STRL REUS W/ TWL LRG LVL3 (GOWN DISPOSABLE) ×2 IMPLANT
GOWN STRL REUS W/ TWL XL LVL3 (GOWN DISPOSABLE) ×1 IMPLANT
GOWN STRL REUS W/TWL LRG LVL3 (GOWN DISPOSABLE) ×2
GOWN STRL REUS W/TWL XL LVL3 (GOWN DISPOSABLE) ×1
IV NS 500ML (IV SOLUTION) ×1
IV NS 500ML BAXH (IV SOLUTION) ×1 IMPLANT
NEEDLE PRECISIONGLIDE 27X1.5 (NEEDLE) ×2 IMPLANT
NS IRRIG 1000ML POUR BTL (IV SOLUTION) ×2 IMPLANT
PACK BASIN DAY SURGERY FS (CUSTOM PROCEDURE TRAY) ×2 IMPLANT
PACK ENT DAY SURGERY (CUSTOM PROCEDURE TRAY) ×2 IMPLANT
PATTIES SURGICAL .5 X3 (DISPOSABLE) ×2 IMPLANT
SHEET SILASTIC 8X6X.030 25-30 (MISCELLANEOUS) IMPLANT
SLEEVE SCD COMPRESS KNEE MED (MISCELLANEOUS) ×2 IMPLANT
SPLINT NASAL AIRWAY SILICONE (MISCELLANEOUS) ×2 IMPLANT
SPONGE GAUZE 2X2 8PLY STRL LF (GAUZE/BANDAGES/DRESSINGS) ×2 IMPLANT
SUT CHROMIC 4 0 PS 2 18 (SUTURE) ×2 IMPLANT
SUT ETHILON 3 0 PS 1 (SUTURE) ×2 IMPLANT
SUT SILK 2 0 FS (SUTURE) ×2 IMPLANT
SUT VIC AB 4-0 P-3 18XBRD (SUTURE) IMPLANT
SUT VIC AB 4-0 P3 18 (SUTURE)
SYR 3ML 18GX1 1/2 (SYRINGE) IMPLANT
TOWEL OR 17X24 6PK STRL BLUE (TOWEL DISPOSABLE) ×4 IMPLANT
TRAY DSU PREP LF (CUSTOM PROCEDURE TRAY) ×2 IMPLANT
YANKAUER SUCT BULB TIP NO VENT (SUCTIONS) ×2 IMPLANT

## 2014-12-19 NOTE — Transfer of Care (Signed)
Immediate Anesthesia Transfer of Care Note  Patient: Andrew Edwards  Procedure(s) Performed: Procedure(s): NASAL SEPTOPLASTY WITH TURBINATE REDUCTION (Bilateral)  Patient Location: PACU  Anesthesia Type:General  Level of Consciousness: awake, alert , oriented and patient cooperative  Airway & Oxygen Therapy: Patient Spontanous Breathing and Patient connected to face mask oxygen  Post-op Assessment: Report given to RN and Post -op Vital signs reviewed and stable  Post vital signs: Reviewed and stable  Last Vitals:  Filed Vitals:   12/19/14 0644  BP: 133/77  Pulse: 77  Temp: 36.4 C  Resp: 20    Complications: No apparent anesthesia complications

## 2014-12-19 NOTE — Anesthesia Procedure Notes (Signed)
Procedure Name: Intubation Date/Time: 12/19/2014 7:45 AM Performed by: Legend Tumminello Pre-anesthesia Checklist: Patient identified, Emergency Drugs available, Suction available and Patient being monitored Patient Re-evaluated:Patient Re-evaluated prior to inductionOxygen Delivery Method: Circle System Utilized Preoxygenation: Pre-oxygenation with 100% oxygen Intubation Type: IV induction Ventilation: Mask ventilation without difficulty Laryngoscope Size: Mac and 3 Grade View: Grade II Tube type: Oral Tube size: 7.0 mm Number of attempts: 1 Airway Equipment and Method: Stylet and Oral airway Placement Confirmation: ETT inserted through vocal cords under direct vision,  positive ETCO2 and breath sounds checked- equal and bilateral Secured at: 22 cm Tube secured with: Tape Dental Injury: Teeth and Oropharynx as per pre-operative assessment

## 2014-12-19 NOTE — Anesthesia Preprocedure Evaluation (Addendum)
Anesthesia Evaluation  Patient identified by MRN, date of birth, ID band Patient awake    Reviewed: Allergy & Precautions, NPO status , Patient's Chart, lab work & pertinent test results  Airway Mallampati: II  TM Distance: >3 FB Neck ROM: Full    Dental no notable dental hx.    Pulmonary neg pulmonary ROS,  breath sounds clear to auscultation  Pulmonary exam normal       Cardiovascular negative cardio ROS  Rhythm:Regular Rate:Normal     Neuro/Psych negative neurological ROS  negative psych ROS   GI/Hepatic negative GI ROS, Neg liver ROS,   Endo/Other  negative endocrine ROSdiabetes, Type 2, Oral Hypoglycemic Agents  Renal/GU negative Renal ROS  negative genitourinary   Musculoskeletal negative musculoskeletal ROS (+)   Abdominal   Peds negative pediatric ROS (+)  Hematology negative hematology ROS (+)   Anesthesia Other Findings   Reproductive/Obstetrics negative OB ROS                            Anesthesia Physical Anesthesia Plan  ASA: II  Anesthesia Plan: General   Post-op Pain Management:    Induction: Intravenous  Airway Management Planned: Oral ETT  Additional Equipment:   Intra-op Plan:   Post-operative Plan: Extubation in OR  Informed Consent: I have reviewed the patients History and Physical, chart, labs and discussed the procedure including the risks, benefits and alternatives for the proposed anesthesia with the patient or authorized representative who has indicated his/her understanding and acceptance.   Dental advisory given  Plan Discussed with: CRNA and Surgeon  Anesthesia Plan Comments:         Anesthesia Quick Evaluation

## 2014-12-19 NOTE — Discharge Instructions (Signed)
Take your regular meds Tylenol , motrin or Hydrocodone 1-2 prn pain Keflex 500 mg twice per day for the next 10 days Apply cool compress to nose if you have much bleeding or swelling Return to see Dr Lucia Gaskins tomorrow at 4:15 to have the nasal packs removed  Call your surgeon if you experience:   1.  Fever over 101.0. 2.  Inability to urinate. 3.  Nausea and/or vomiting. 4.  Extreme swelling or bruising at the surgical site. 5.  Continued bleeding from the incision. 6.  Increased pain, redness or drainage from the incision. 7.  Problems related to your pain medication. 8.  Any problems and/or concerns   Post Anesthesia Home Care Instructions  Activity: Get plenty of rest for the remainder of the day. A responsible adult should stay with you for 24 hours following the procedure.  For the next 24 hours, DO NOT: -Drive a car -Paediatric nurse -Drink alcoholic beverages -Take any medication unless instructed by your physician -Make any legal decisions or sign important papers.  Meals: Start with liquid foods such as gelatin or soup. Progress to regular foods as tolerated. Avoid greasy, spicy, heavy foods. If nausea and/or vomiting occur, drink only clear liquids until the nausea and/or vomiting subsides. Call your physician if vomiting continues.  Special Instructions/Symptoms: Your throat may feel dry or sore from the anesthesia or the breathing tube placed in your throat during surgery. If this causes discomfort, gargle with warm salt water. The discomfort should disappear within 24 hours.

## 2014-12-19 NOTE — Brief Op Note (Signed)
12/19/2014  9:52 AM  PATIENT:  Andrew Edwards  49 y.o. male  PRE-OPERATIVE DIAGNOSIS:  SEPTAL DEVIATION/TURBINATE HYPERTROPHY  POST-OPERATIVE DIAGNOSIS:  SEPTAL DEVIATION/TURBINATE HYPERTROPHY  PROCEDURE:  Procedure(s): NASAL SEPTOPLASTY WITH TURBINATE REDUCTION (Bilateral)  SURGEON:  Surgeon(s) and Role:    * Rozetta Nunnery, MD - Primary  PHYSICIAN ASSISTANT:   ASSISTANTS: none   ANESTHESIA:   general  EBL:  Total I/O In: 1000 [I.V.:1000] Out: -   BLOOD ADMINISTERED:none  DRAINS: none   LOCAL MEDICATIONS USED:  XYLOCAINE with EPI  10 cc  SPECIMEN:  Source of Specimen:  no specimen  DISPOSITION OF SPECIMEN:  N/A  COUNTS:  YES  TOURNIQUET:  * No tourniquets in log *  DICTATION: .Other Dictation: Dictation Number [id number]  PLAN OF CARE: Discharge to home after PACU  PATIENT DISPOSITION:  PACU - hemodynamically stable.   Delay start of Pharmacological VTE agent (>24hrs) due to surgical blood loss or risk of bleeding: yes

## 2014-12-19 NOTE — Interval H&P Note (Signed)
History and Physical Interval Note:  12/19/2014 7:33 AM  Andrew Edwards  has presented today for surgery, with the diagnosis of SEPTAL DEVIATION/TURBINATE HYPERTROPHY  The various methods of treatment have been discussed with the patient and family. After consideration of risks, benefits and other options for treatment, the patient has consented to  Procedure(s): NASAL SEPTOPLASTY WITH TURBINATE REDUCTION (Bilateral) as a surgical intervention .  The patient's history has been reviewed, patient examined, no change in status, stable for surgery.  I have reviewed the patient's chart and labs.  Questions were answered to the patient's satisfaction.     Sofie Schendel

## 2014-12-19 NOTE — Anesthesia Postprocedure Evaluation (Signed)
  Anesthesia Post-op Note  Patient: Andrew Edwards  Procedure(s) Performed: Procedure(s) (LRB): NASAL SEPTOPLASTY WITH TURBINATE REDUCTION (Bilateral)  Patient Location: PACU  Anesthesia Type: General  Level of Consciousness: awake and alert   Airway and Oxygen Therapy: Patient Spontanous Breathing  Post-op Pain: mild  Post-op Assessment: Post-op Vital signs reviewed, Patient's Cardiovascular Status Stable, Respiratory Function Stable, Patent Airway and No signs of Nausea or vomiting  Last Vitals:  Filed Vitals:   12/19/14 1015  BP: 157/91  Pulse: 78  Temp:   Resp: 14    Post-op Vital Signs: stable   Complications: No apparent anesthesia complications

## 2014-12-19 NOTE — H&P (Signed)
PREOPERATIVE H&P  Chief Complaint: sinus problems  HPI: Andrew Edwards is a 49 y.o. male who presents for evaluation of sinus problems. He has trouble breathing through his nose. CT scan demonstrated septal deviation and  Turbinate swelling with relatively clear sinuses. He's taken to the OR for septoplasty and TR.  Past Medical History  Diagnosis Date  . Diabetes mellitus   . Seasonal allergies   . Wears contact lenses    Past Surgical History  Procedure Laterality Date  . Hernia repair  2000    umb  . Tonsillectomy    . Colonoscopy     History   Social History  . Marital Status: Married    Spouse Name: N/A  . Number of Children: N/A  . Years of Education: N/A   Social History Main Topics  . Smoking status: Never Smoker   . Smokeless tobacco: Not on file  . Alcohol Use: No  . Drug Use: No  . Sexual Activity: Not on file   Other Topics Concern  . None   Social History Narrative   Family History  Problem Relation Age of Onset  . COPD Other    No Known Allergies Prior to Admission medications   Medication Sig Start Date End Date Taking? Authorizing Provider  acetaminophen (TYLENOL) 325 MG tablet Take 325 mg by mouth as needed.   Yes Historical Provider, MD  fluticasone (FLONASE) 50 MCG/ACT nasal spray Place 1-2 sprays into both nostrils daily.   Yes Historical Provider, MD  ibuprofen (ADVIL,MOTRIN) 200 MG tablet Take 200 mg by mouth every 6 (six) hours as needed.   Yes Historical Provider, MD  metFORMIN (GLUCOPHAGE) 500 MG tablet Take 500 mg by mouth 2 (two) times daily with a meal.   Yes Historical Provider, MD     Positive ROS: trouble breathing   All other systems have been reviewed and were otherwise negative with the exception of those mentioned in the HPI and as above.  Physical Exam: Filed Vitals:   12/19/14 0644  BP: 133/77  Pulse: 77  Temp: 97.6 F (36.4 C)  Resp: 20    General: Alert, no acute distress Oral: Normal oral mucosa and  tonsils Nasal: significant septal deviation and large turbinates Neck: No palpable adenopathy or thyroid nodules Ear: Ear canal is clear with normal appearing TMs Cardiovascular: Regular rate and rhythm, no murmur.  Respiratory: Clear to auscultation Neurologic: Alert and oriented x 3   Assessment/Plan: SEPTAL DEVIATION/TURBINATE HYPERTROPHY Plan for Procedure(s): NASAL SEPTOPLASTY WITH TURBINATE REDUCTION   Melony Overly, MD 12/19/2014 7:30 AM

## 2014-12-20 NOTE — Op Note (Signed)
NAME:  Andrew Edwards, Andrew Edwards        ACCOUNT NO.:  [id number]  MEDICAL RECORD NO.:  [id number]  LOCATION:                                 FACILITY:  PHYSICIAN:  [place] Lucia Gaskins, M.D.DATE OF BIRTH:  24-May-1966  DATE OF PROCEDURE:  12/19/2014 DATE OF DISCHARGE:  12/19/2014                              OPERATIVE REPORT   PREOPERATIVE DIAGNOSIS:  Septal deviation with turbinate hypertrophy and nasal obstruction and sinus problems.  POSTOPERATIVE DIAGNOSIS:  Septal deviation with turbinate hypertrophy and nasal obstruction and sinus problems.  OPERATION PERFORMED:  Septoplasty with bilateral inferior turbinate reductions.  SURGEON:  Leonides Sake. Lucia Gaskins, M.D.  ANESTHESIA:  General endotracheal.  COMPLICATIONS:  None.  BRIEF CLINICAL NOTE:  Andrew Edwards is a 49 year old gentleman who has had a lot of problems with nasal obstruction.  He had trouble breathing through his nose as well as history of recurrent sinus infections.  He had a limited CT scan of his sinus which showed  fairly  clear sinuses except for a small amount of thickening along the floor of the left maxillary sinus, but he had a significant septal deviation much  worse to the left side with a large left septal spur protruding into the left inferior turbinate.  He has compensatory turbinate hypertrophy on the right side.  There are no polyps, no obstructing lesions in the nose, however, on intranasal exam he has a chronic sore on the left anterior septum where he has had a lot of scabbing, crusting, and bleeding.  He is taken to the operating room at this time for septoplasty and bilateral inferior turbinate reduction to help improve his nasal airway.  DESCRIPTION OF PROCEDURE:  A hemitransfixion incision was made along the caudal edge of the septum on the right side.  Mucoperichondrial and mucoperiosteal flaps were then elevated along the left side of the cartilaginous septum and after elevating  approximately 1 cm of mucoperichondrium off the septum on the left side, the area of crusting and scabbing was encountered, and at this point, there was a small 1 to 1.5 cm perforation of the cartilaginous septum.  Elevation of mucoperichondrium was performed around the perforation site.  Then more posteriorly the cartilage was intact and it was elevated the mucoperichondrium up the cartilage as well as the bone.  He had a large septal spur on the left side protruding into the left inferior turbinate.  Mucoperichondrial and mucoperiosteal flaps were elevated on either side of the septal spur and this was removed.  The septum had buckled to the left side with cartilaginous septum anteriorly protruding into the left airway.  In order to correct the cartilaginous septum and straighten it, about 2-3 mm section of cartilaginous septum was removed horizontally from the anterior septum.  This completed the septoplasty portion of the procedure.  Next, the inferior turbinates were reduced with the [employer] turbinate blade.  This was performed bilaterally and then the turbinate bone was outfractured.  Suction cautery was used for hemostasis.  This significantly improved the nasal airway.  The middle meatus and OMC regions were examined bilaterally and this was clear of any disease.  Prior to closing the hemitransfixion incision, a small piece of cartilage measuring 1.5 x 2  cm was trimmed and placed in the area where the septal perforation was on the right side.  The septum was basted with a 3-0 chromic sutures trying to bring up some of the mucosa and area that was kind of granular or had granulation tissue on the left side trying to cover up the perforation site.  The perforation did not involve the right mucoperichondrium, and this was all intact.  Following this, Doyle splints were placed on either side of the septum and secured with a 3-0 nylon suture.  The nose was packed with Telfa soaked  in bacitracin ointment, placed on either side of the Doyle splints.  The patient was awoken from anesthesia and transferred to the recovery room postop doing well.  DISPOSITION:  The patient was placed on Keflex 500 mg b.i.d. for 10 days, placed on Tylenol, Motrin, or Vicodin p.r.n. pain.  We will have him follow up in my office tomorrow to have his pack removed and he will follow up in 10-12 days to have the Doyle splints removed.          ______________________________ Leonides Sake Lucia Gaskins, M.D.     CEN/MEDQ  D:  12/19/2014  T:  12/20/2014  Job:  [id number]  cc:   Leonides Sake. Lucia Gaskins, M.D. Delmar Landau) Harrington Challenger, M.D.

## 2014-12-23 ENCOUNTER — Encounter (HOSPITAL_BASED_OUTPATIENT_CLINIC_OR_DEPARTMENT_OTHER): Payer: Self-pay | Admitting: Otolaryngology

## 2014-12-23 LAB — POCT HEMOGLOBIN-HEMACUE: Hemoglobin: 15.2 g/dL (ref 13.0–17.0)

## 2015-07-07 IMAGING — CT CT PARANASAL SINUSES LIMITED
1 series · 9 of 11 positions shown, 12 images · non-contrast
Comparison: None.

CLINICAL DATA: Nasal obstruction.  Frequent sinus infections.

EXAM:
CT PARANASAL SINUS LIMITED WITHOUT CONTRAST
TECHNIQUE: Non-contiguous multidetector CT images of the paranasal sinuses were
obtained in a single plane without contrast.

[Series 3: coronal soft · axial · 0.37mm/px · z∈[+31,+111]mm · 9 of 11 slices shown, 12 images]
[im 2/11  brain]
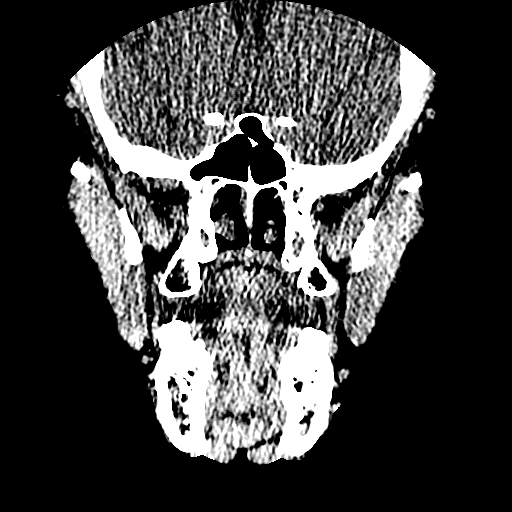
[im 2/11  bone]
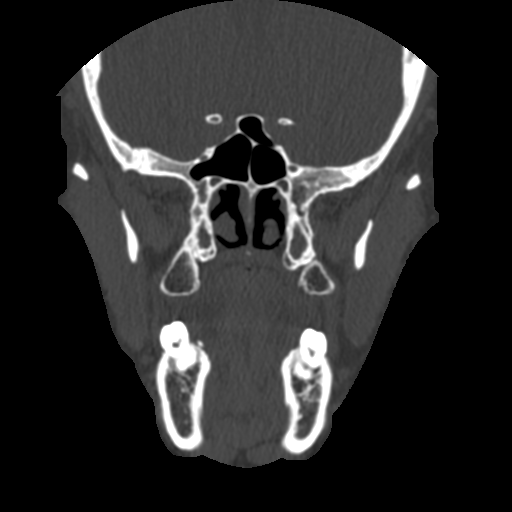
[im 3/11  bone]
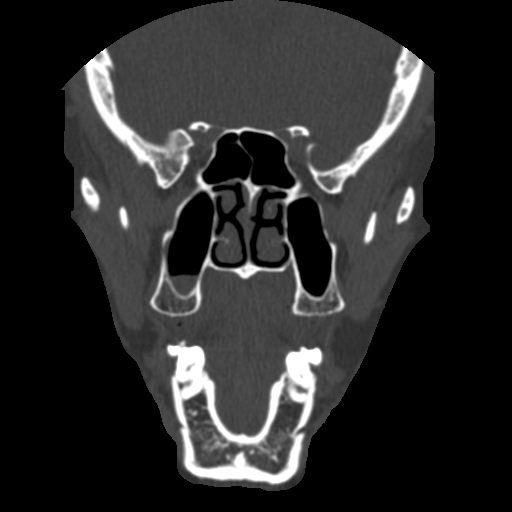
[im 4/11  bone]
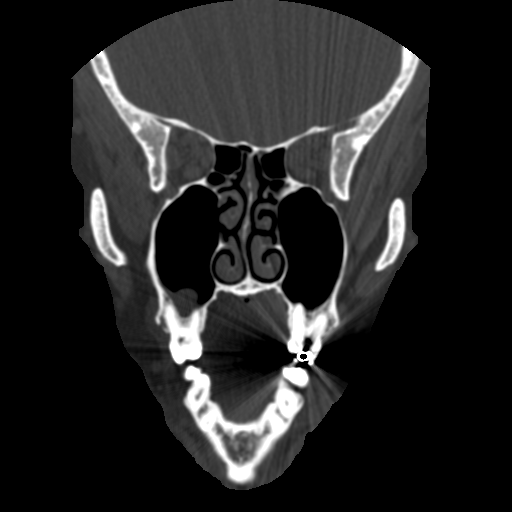
[im 5/11  bone]
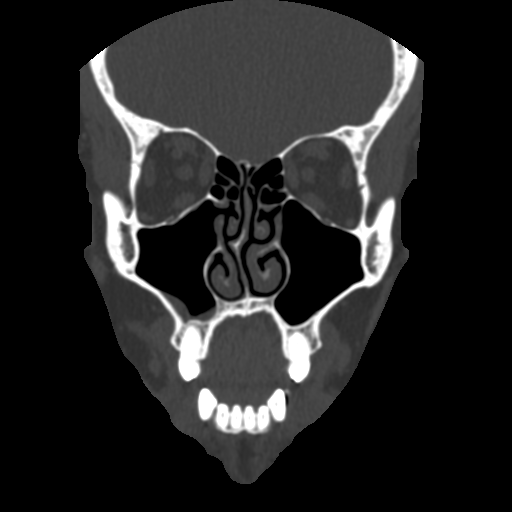
[im 6/11  brain]
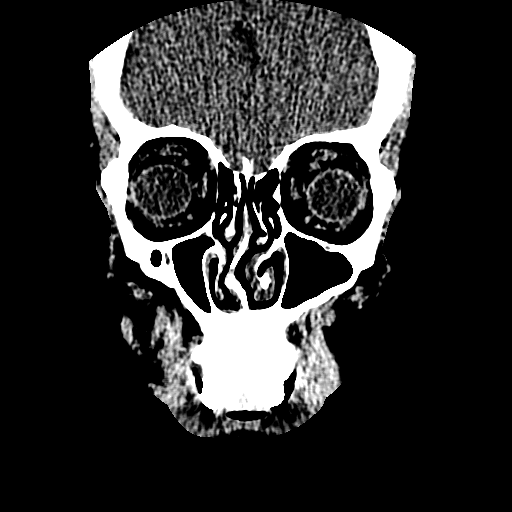
[im 6/11  bone]
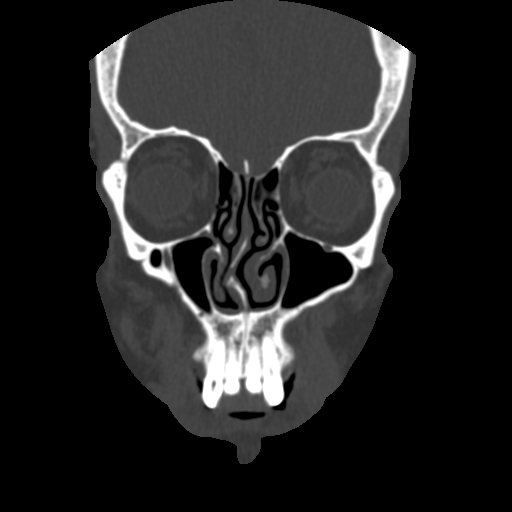
[im 7/11  bone]
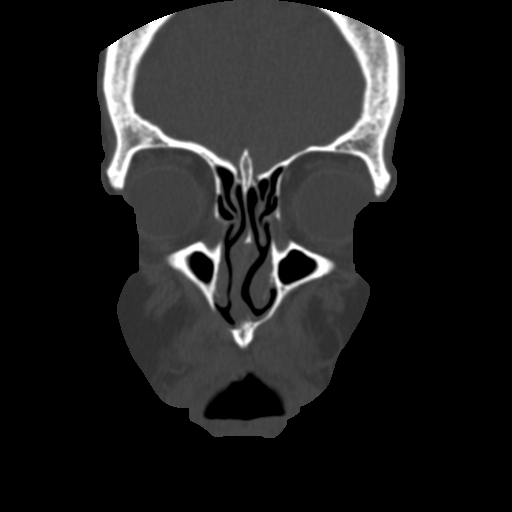
[im 8/11  bone]
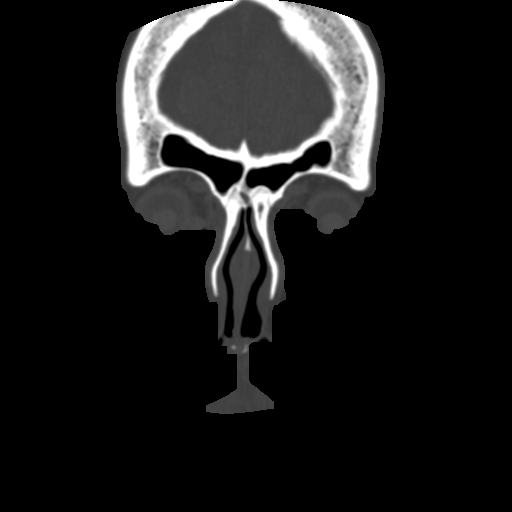
[im 9/11  bone]
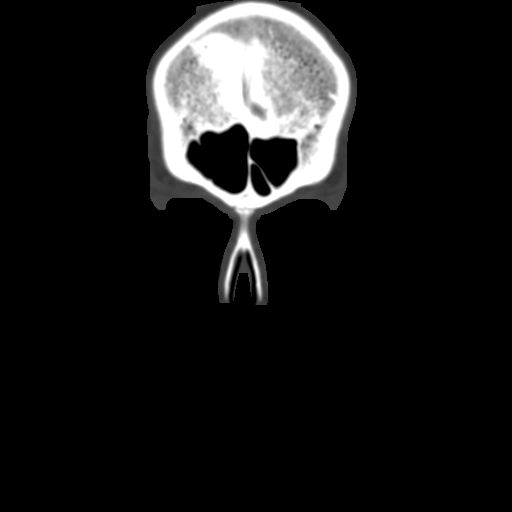
[im 10/11  brain]
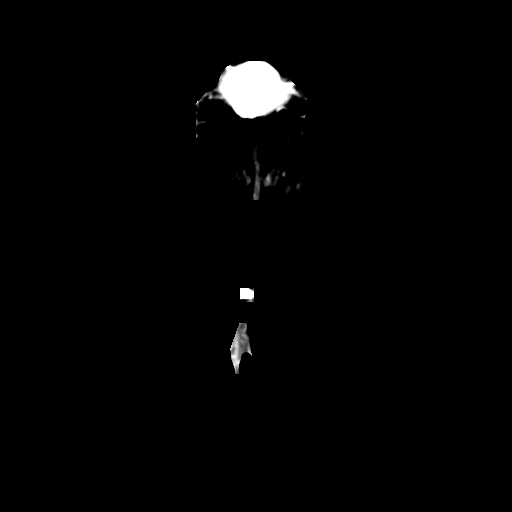
[im 10/11  bone]
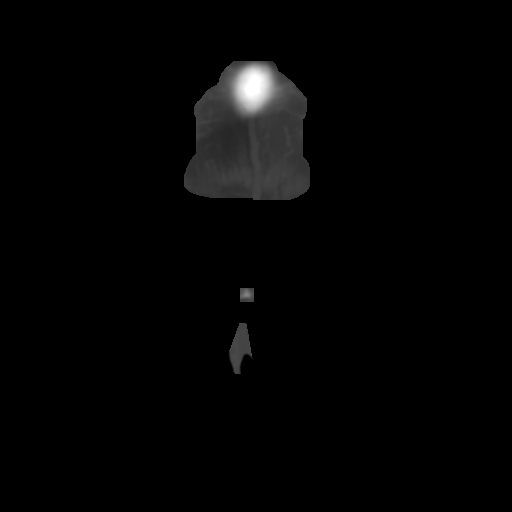

[9 of 11 positions shown; findings below may reference images not displayed]

FINDINGS: Mild mucosal thickening is present in the alveolar recess of the
left maxillary sinus. Mild osseous thickening of the lateral wall of
the left maxillary sinus is suggestive of chronic sinusitis.
Visualized portions of the other paranasal sinuses are clear. No
air-fluid levels are seen. There is leftward nasal septal deviation
and spurring. Visualized soft tissues are grossly unremarkable.
IMPRESSION: Mild, chronic left maxillary sinusitis. Leftward nasal septal
deviation.

## 2015-07-19 IMAGING — US US SOFT TISSUE HEAD/NECK
1 series · 14 of 25 positions shown · non-contrast
Comparison: None.

CLINICAL DATA: ENLARGEMENT

EXAM:
THYROID ULTRASOUND
TECHNIQUE: Ultrasound examination of the thyroid gland and adjacent soft
tissues was performed.

[Series 1: us soft tissue head/neck · 0.11mm/px · 14 of 50 slices shown]
[im 1/50]
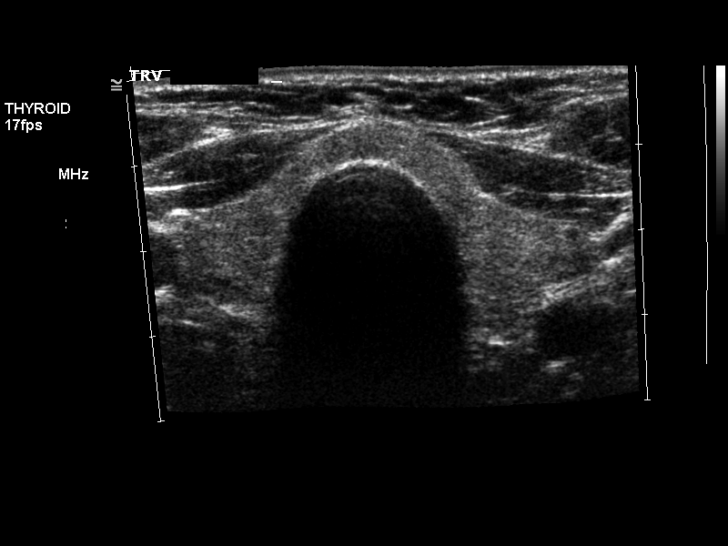
[im 5/50]
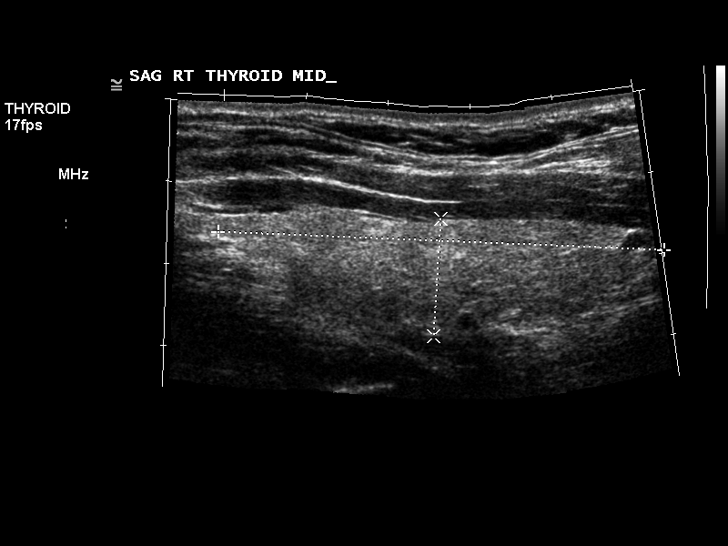
[im 9/50]
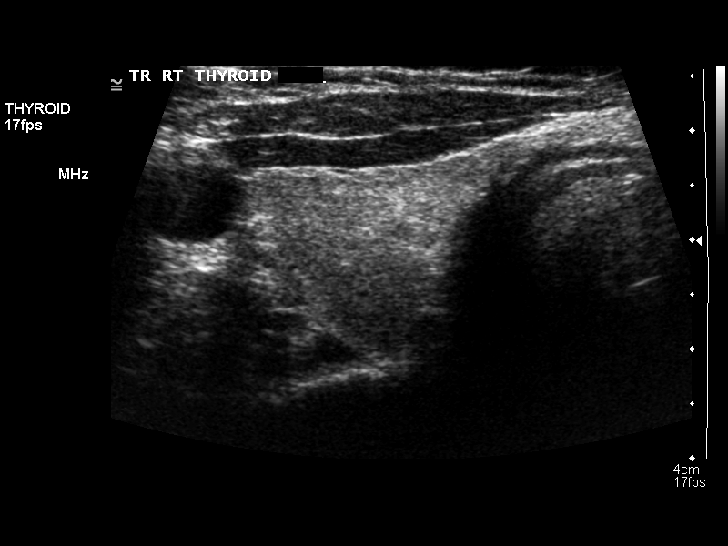
[im 13/50]
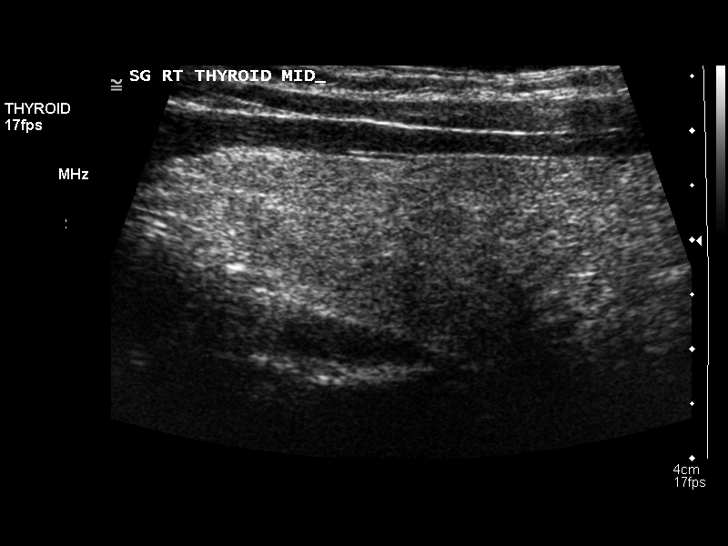
[im 17/50]
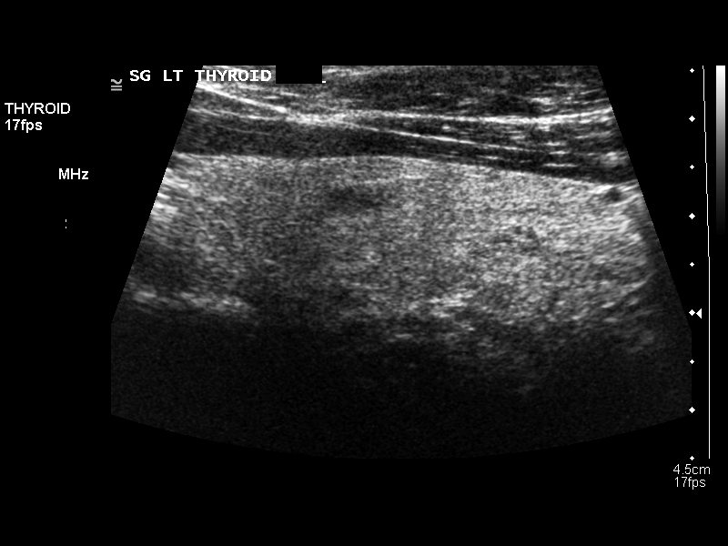
[im 19/50]
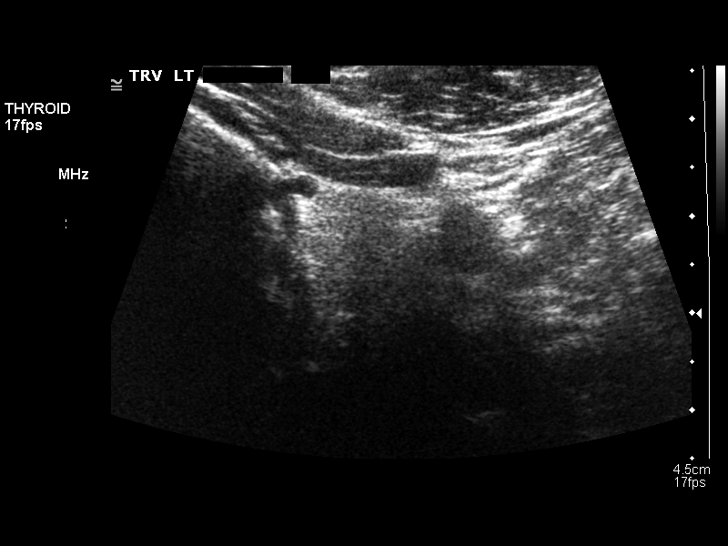
[im 23/50]
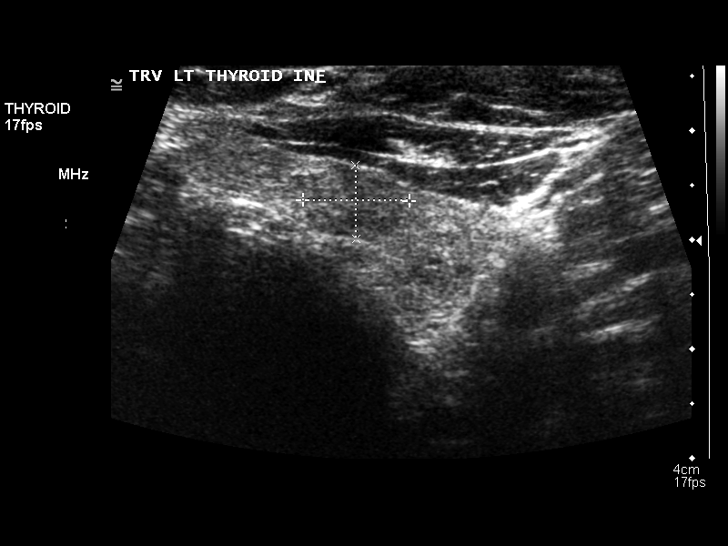
[im 27/50]
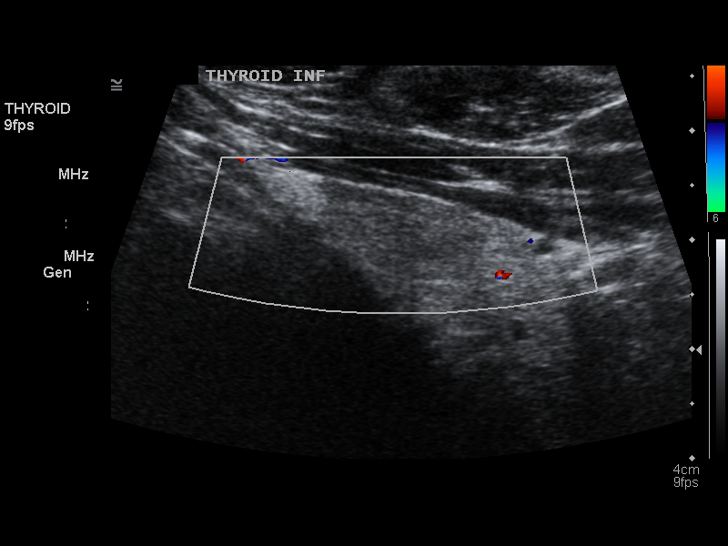
[im 31/50]
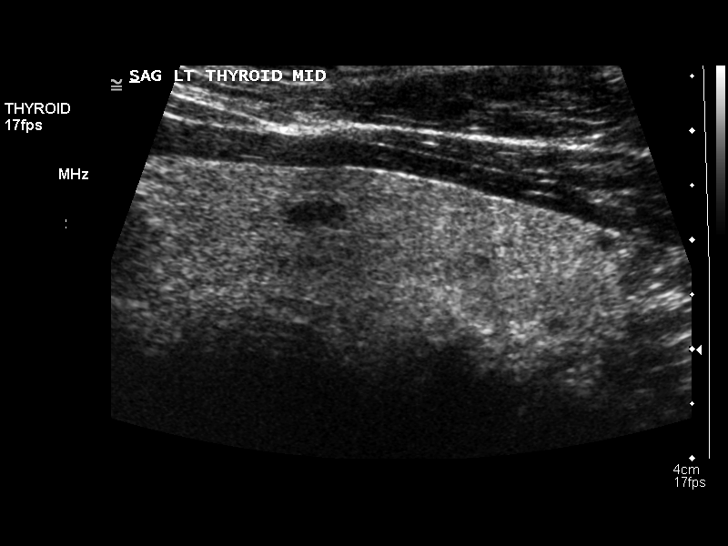
[im 33/50]
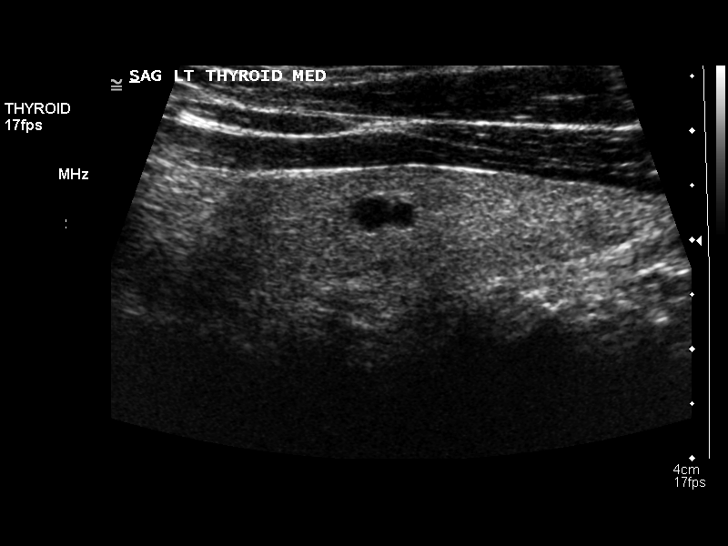
[im 37/50]
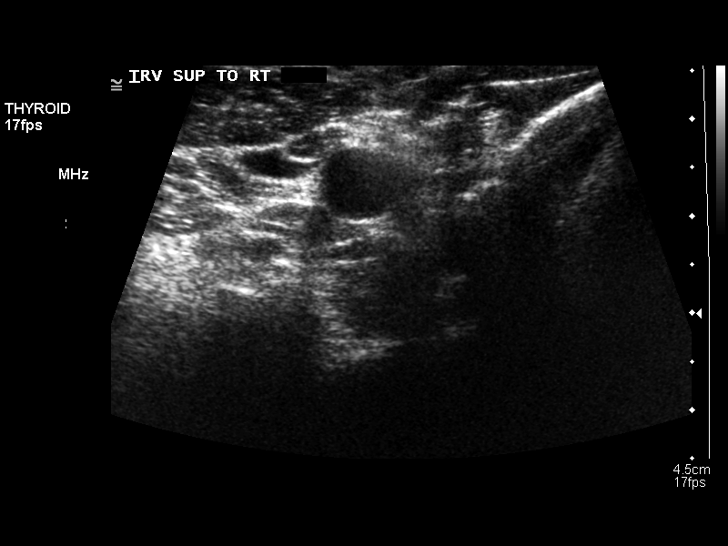
[im 41/50]
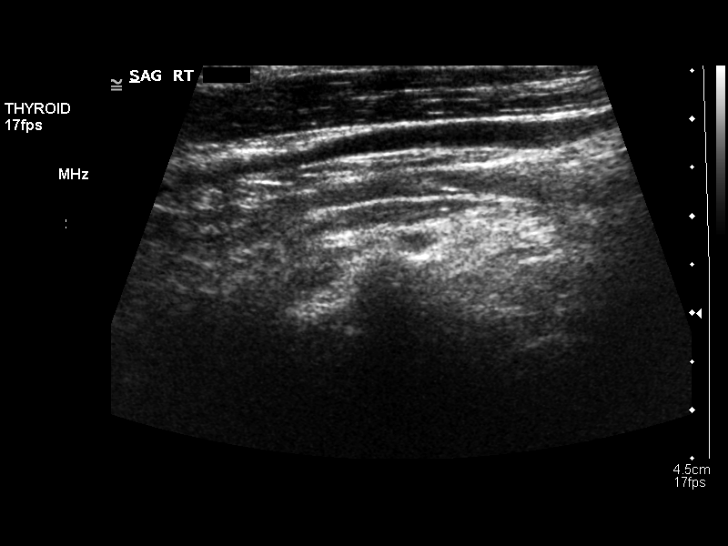
[im 45/50]
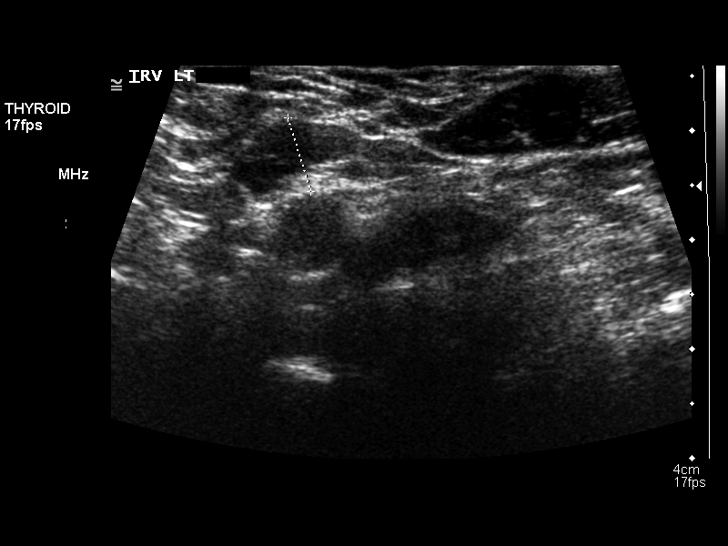
[im 50/50]
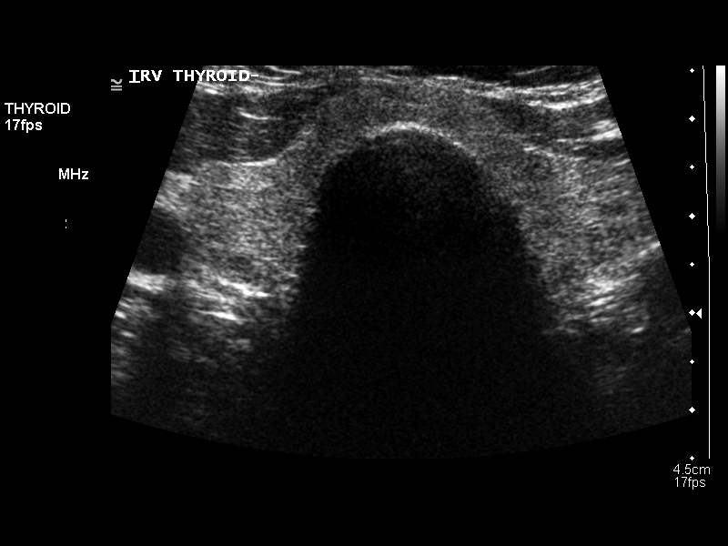

[14 of 25 positions shown; findings below may reference images not displayed]

FINDINGS: Right thyroid lobe

Measurements: 54 x 14 x 18 mm.  No nodules visualized.

Left thyroid lobe

Measurements: 53 x 17 x 20 mm. 4 mm hypoechoic nodule, superior
mid lobe.

Isthmus

Thickness: 4.9 mm.  No nodules visualized.

Lymphadenopathy

None visualized.
IMPRESSION: 1. Borderline enlarged thyroid with several small left nodules.
Findings do not meet current consensus criteria for biopsy.
Follow-up by clinical exam is recommended. If patient has known risk
factors for thyroid carcinoma, consider follow-up ultrasound in 12
months. If patient is clinically hyperthyroid, consider nuclear
medicine thyroid uptake and scan. This recommendation follows the
consensus statement: Management of Thyroid Nodules Detected as US:
Society of Radiologists in Ultrasound Consensus Conference

## 2015-07-24 ENCOUNTER — Ambulatory Visit (INDEPENDENT_AMBULATORY_CARE_PROVIDER_SITE_OTHER): Payer: Self-pay | Admitting: Family Medicine

## 2015-07-24 VITALS — BP 128/90 | HR 66 | Wt 247.0 lb

## 2015-07-24 DIAGNOSIS — E119 Type 2 diabetes mellitus without complications: Secondary | ICD-10-CM

## 2015-07-24 NOTE — Progress Notes (Signed)
Subjective:  Patient presents today for 6 month diabetes follow-up as part of the employer-sponsored Link to Wellness program.  Current diabetes regimen includes metformin 500 mg twice daily.Most recent MD follow-up was in august. No med changes or major health changes at this time.    Assessment:  Diabetes: Most recent A1C was 6.2% which is at goal of less than 7%. Weight is stable from last visit with me.    Physical Activity- has begun exercising at [employer] 2 days per week for 25 mins  Follow up with me in 3 months.    Plan/Goals for Next Visit:  1. Will try to increase exercise to 150 minutes per week as work schedule allows. 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     Next appointment to see me is: march 2017

## 2015-08-04 NOTE — Progress Notes (Signed)
Patient ID: Andrew Edwards, male   DOB: November 24, 1965, 49 y.o.   MRN: [id number]  I have reviewed this chart documentation

## 2016-01-29 DIAGNOSIS — E119 Type 2 diabetes mellitus without complications: Secondary | ICD-10-CM | POA: Diagnosis not present

## 2016-01-29 DIAGNOSIS — H4423 Degenerative myopia, bilateral: Secondary | ICD-10-CM | POA: Diagnosis not present

## 2016-05-31 ENCOUNTER — Encounter: Payer: Self-pay | Admitting: Pharmacist

## 2016-05-31 ENCOUNTER — Other Ambulatory Visit: Payer: Self-pay | Admitting: Pharmacist

## 2016-05-31 NOTE — Patient Outreach (Signed)
[facility]) Care Management  [place]   05/31/2016  Andrew Edwards 03/11/1966 [id number]  Subjective: Patient presents today for diabetes follow-up as part of the employer-sponsored Link to Wellness program.  He was last seen with the Link to Wellness program a year ago.  Current diabetes regimen includes metformin 500 mg twice daily.  Patient is not currently on statin or ASA. Most recent MD follow-up with Dr Harrington Challenger was 05/2015.  Patient has a pending appt for 05/2016.  No med changes or major health changes at this time.   Patient denies hypoglycemic events.  Patient reported dietary habits: Limit snacking and moderate to low carbohydrate.  Keeps potatoes and bagels and other high density carbohydrates to a minimum.  Drinks skim milk, coffee with sweet and low/creamer, and diet sodas.  Patient reported exercise habits:    Patient reports nocturia 1 time per night which has not changed.  Patient denies pain/burning upon urination.  Patient denies neuropathy. Patient denies visual changes. Patient reports self foot exams daily. Denies changes.  Patient is Not currently checking CBGs at home due to good A1C per primary care provider request.   Objective:  05/30/15 A1C 6.2  Vitals:   05/31/16 1146  BP: (!) 147/90  Pulse: 76    Encounter Medications: Outpatient Encounter Prescriptions as of 05/31/2016  Medication Sig  . fluticasone (FLONASE) 50 MCG/ACT nasal spray Place 2 sprays into both nostrils daily.  . metFORMIN (GLUCOPHAGE) 500 MG tablet Take 500 mg by mouth 2 (two) times daily with a meal.   No facility-administered encounter medications on file as of 05/31/2016.     Functional Status: In your present state of health, do you have any difficulty performing the following activities: 07/24/2015  Hearing? N  Vision? N  Difficulty concentrating or making decisions? N  Walking or climbing stairs? N  Dressing or bathing? N  Doing errands, shopping? N   Some recent data might be hidden    Fall/Depression Screening: PHQ 2/9 Scores 07/24/2015  PHQ - 2 Score 0     Assessment:  Diabetes: Most recent A1C was 6.2% which is at at goal of less than 7%. Patient has not seen his primary care provider in 1 year with appointment pending. Patient is not currently on aspirin or a statin. Patient not currently checking his blood glucose due to provider preference.  10 year ASCVD risk 5.2%.   Lifestyle improvements:  Physical Activity- Recently increased exercise due to walking his new puppy everyday.  At goal of >150 mins walking per day. Patient plans to increase as his puppy gets older  Nutrition- Patient reports following a low carbohydrate diet   Plan/Goals for Next Visit:  -Patient will schedule followup A1C and yearly exam with primary care provider -Encouraged patient to continue low carbohydrate diet -Counseled on need for exercise and encouraged patient to continue walking 30 mins/day and increase gradually over time -Patient has set goal to lose 5 lbs over the next 90 days  Next appointment to see me is: 08/23/16 at 1130 am via telephone   Bennye Alm, PharmD [facility] PGY2 Pharmacy Resident [phone]

## 2016-08-17 DIAGNOSIS — E119 Type 2 diabetes mellitus without complications: Secondary | ICD-10-CM | POA: Diagnosis not present

## 2016-08-17 DIAGNOSIS — Z7984 Long term (current) use of oral hypoglycemic drugs: Secondary | ICD-10-CM | POA: Diagnosis not present

## 2016-08-17 DIAGNOSIS — Z0000 Encounter for general adult medical examination without abnormal findings: Secondary | ICD-10-CM | POA: Diagnosis not present

## 2016-08-20 DIAGNOSIS — Z0000 Encounter for general adult medical examination without abnormal findings: Secondary | ICD-10-CM | POA: Diagnosis not present

## 2016-08-20 DIAGNOSIS — E119 Type 2 diabetes mellitus without complications: Secondary | ICD-10-CM | POA: Diagnosis not present

## 2016-08-23 ENCOUNTER — Ambulatory Visit: Payer: Self-pay

## 2016-08-23 ENCOUNTER — Encounter: Payer: Self-pay | Admitting: Pharmacist

## 2016-08-23 ENCOUNTER — Other Ambulatory Visit: Payer: Self-pay | Admitting: Pharmacist

## 2016-08-23 VITALS — BP 140/72 | Wt 251.0 lb

## 2016-08-23 DIAGNOSIS — E119 Type 2 diabetes mellitus without complications: Secondary | ICD-10-CM

## 2016-08-23 NOTE — Patient Outreach (Signed)
Subjective: Patient presents today for 3 month diabetes follow-up as part of the employer-sponsored Link to Wellness program.  Current diabetes regimen includes metformin 500 mg PO BID.  Patient is not currently on daily aspirin or statin.  Most recent MD follow-up was last week.   No med changes or major health changes at this time.  Patient reported dietary habits: Eats low carbohydrate diet the majority of the time  Patient reported exercise habits: no formal exercise program, patient did have a new puppy however had to return the puppy to the shelter as it did not fit with the family's life.  Patient denies hypoglycemic events.  Patient reports self foot exams.   Patient does not monitor home blood glucose at instruction of his physician.   Per patient, he had a visit with his PCP for annual physical last week however lab results for A1C have not come back yet. He reports his BP was 140/72 and weight was 251 lbs.   Objective:  Lab Results  Component Value Date   HGBA1C 6.6 (H) 01/16/2008   Vitals:   08/23/16 1323  BP: 140/72    Lipid Panel     Component Value Date/Time   CHOL 192 01/16/2008 0843   TRIG 220 (HH) 01/16/2008 0843   TRIG 154 (H) 10/14/2006 0816   HDL 29.9 (L) 01/16/2008 0843   CHOLHDL 6.4 CALC 01/16/2008 0843   VLDL 44 (H) 01/16/2008 0843   LDLCALC 107 (H) 07/18/2007 0810   LDLDIRECT 119.8 01/16/2008 0843     10 year ASCVD risk: 6% however lipid panel is >69 years old  Assessment:  Diabetes: Most recent A1C was 6.2% which is at goal of less than 7%. Weight is increased per patient as he has not implemented a formal exercise program due to demanding work schedule. Patient is not currently on aspirin or a statin. He is not checking his blood sugar at his primary care provider's instruction. 10 year ASCVD risk is 6% using last available lipid panel (>5 yrs ago)  Lifestyle improvements:  Physical Activity- Recently decreased exercise as their new dog had to  be returned to the shelter. Below goal of >150 mins of exercise/week.  Nutrition- Patient reports adhering to a low carbohydrate diet majority of the time.  Link to Wellness Continuity:  Patient amenable to transition to [employer] app for participation in the [employer] To Wellness program. Patient was registered for the smart phone-based app during this visit.  Plan/Goals for Next Visit:  [facility] CM Care Plan Problem One   Flowsheet Row Most Recent Value  Care Plan Problem One  Inadequate physical exercise as evidenced by patient report  Role Documenting the Problem One  Clinical Pharmacist  Care Plan for Problem One  Active  [facility] Long Term Goal (31-90 days)  patient will work on increasing exercise over the next 3 months to goal of 150 minutes per week  [facility] Long Term Goal Start Date  07/24/15  Interventions for Problem One Long Term Goal  Counseled on importance of exercise with his diabetes and counseled on low carbohydrate diet.  Encouraged patient to continue to walk everyday and to increase gradually    [facility] CM Care Plan Problem Two   Flowsheet Row Most Recent Value  Care Plan Problem Two  Weight management as evidenced by patient goal weight  Role Documenting the Problem Two  Clinical Pharmacist  Care Plan for Problem Two  Active  Interventions for Problem Two Long Term Goal   Counseled on  importance of exercise with his diabetes and counseled on low carbohydrate diet.  Encouraged patient to restart walking everyday and to increase gradually. Counseled patient to walk at work to help meet this goal.  [facility] Long Term Goal (31-90) days  Patient will work towards losing 5 lbs over the next 90 days as evidenced by return weight  [facility] Long Term Goal Start Date  06/02/16     Next visit to see PGY1 ambulatory care pharmacy resident is 11/15/16 with Belia Heman, PharmD.   Carlean Jews, Pharm.D. PGY1 Pharmacy Resident 10/30/20172:27 PM Pager [phone]

## 2016-09-24 ENCOUNTER — Encounter: Payer: Self-pay | Admitting: Gastroenterology

## 2016-11-15 ENCOUNTER — Ambulatory Visit: Payer: Self-pay

## 2016-12-17 ENCOUNTER — Encounter: Payer: 59 | Admitting: Gastroenterology

## 2016-12-23 HISTORY — PX: COLONOSCOPY: SHX174

## 2016-12-31 ENCOUNTER — Ambulatory Visit (AMBULATORY_SURGERY_CENTER): Payer: Self-pay

## 2016-12-31 VITALS — Ht 73.0 in | Wt 254.6 lb

## 2016-12-31 DIAGNOSIS — Z1211 Encounter for screening for malignant neoplasm of colon: Secondary | ICD-10-CM

## 2016-12-31 MED ORDER — SUPREP BOWEL PREP KIT 17.5-3.13-1.6 GM/177ML PO SOLN: 1.0000 | Freq: Once | ORAL | 0 refills | Status: AC

## 2016-12-31 NOTE — Progress Notes (Signed)
No allergies to eggs or soy No past problems with anesthesia No diet meds No home oxygen  Declined emmi 

## 2017-01-04 ENCOUNTER — Encounter: Payer: Self-pay | Admitting: Gastroenterology

## 2017-01-14 ENCOUNTER — Ambulatory Visit (AMBULATORY_SURGERY_CENTER): Payer: 59 | Admitting: Gastroenterology

## 2017-01-14 ENCOUNTER — Encounter: Payer: Self-pay | Admitting: Gastroenterology

## 2017-01-14 VITALS — BP 116/69 | HR 68 | Temp 98.2°F | Resp 18 | Ht 73.0 in | Wt 254.0 lb

## 2017-01-14 DIAGNOSIS — D125 Benign neoplasm of sigmoid colon: Secondary | ICD-10-CM | POA: Diagnosis not present

## 2017-01-14 DIAGNOSIS — E119 Type 2 diabetes mellitus without complications: Secondary | ICD-10-CM | POA: Diagnosis not present

## 2017-01-14 DIAGNOSIS — Z1212 Encounter for screening for malignant neoplasm of rectum: Secondary | ICD-10-CM | POA: Diagnosis not present

## 2017-01-14 DIAGNOSIS — K635 Polyp of colon: Secondary | ICD-10-CM

## 2017-01-14 DIAGNOSIS — D124 Benign neoplasm of descending colon: Secondary | ICD-10-CM | POA: Diagnosis not present

## 2017-01-14 DIAGNOSIS — D123 Benign neoplasm of transverse colon: Secondary | ICD-10-CM

## 2017-01-14 DIAGNOSIS — Z1211 Encounter for screening for malignant neoplasm of colon: Secondary | ICD-10-CM

## 2017-01-14 MED ORDER — SODIUM CHLORIDE 0.9 % IV SOLN: 500.0000 mL | INTRAVENOUS | Status: DC

## 2017-01-14 NOTE — Patient Instructions (Signed)
YOU HAD AN ENDOSCOPIC PROCEDURE TODAY AT [facility]:   Refer to the procedure report that was given to you for any specific questions about what was found during the examination.  If the procedure report does not answer your questions, please call your gastroenterologist to clarify.  If you requested that your care partner not be given the details of your procedure findings, then the procedure report has been included in a sealed envelope for you to review at your convenience later.  YOU SHOULD EXPECT: Some feelings of bloating in the abdomen. Passage of more gas than usual.  Walking can help get rid of the air that was put into your GI tract during the procedure and reduce the bloating. If you had a lower endoscopy (such as a colonoscopy or flexible sigmoidoscopy) you may notice spotting of blood in your stool or on the toilet paper. If you underwent a bowel prep for your procedure, you may not have a normal bowel movement for a few days.  Please Note:  You might notice some irritation and congestion in your nose or some drainage.  This is from the oxygen used during your procedure.  There is no need for concern and it should clear up in a day or so.  SYMPTOMS TO REPORT IMMEDIATELY:   Following lower endoscopy (colonoscopy or flexible sigmoidoscopy):  Excessive amounts of blood in the stool  Significant tenderness or worsening of abdominal pains  Swelling of the abdomen that is new, acute  Fever of 100F or higher    For urgent or emergent issues, a gastroenterologist can be reached at any hour by calling [phone].   DIET:  We do recommend a small meal at first, but then you may proceed to your regular diet.  Drink plenty of fluids but you should avoid alcoholic beverages for 24 hours.  ACTIVITY:  You should plan to take it easy for the rest of today and you should NOT DRIVE or use heavy machinery until tomorrow (because of the sedation medicines used during the test).     FOLLOW UP: Our staff will call the number listed on your records the next business day following your procedure to check on you and address any questions or concerns that you may have regarding the information given to you following your procedure. If we do not reach you, we will leave a message.  However, if you are feeling well and you are not experiencing any problems, there is no need to return our call.  We will assume that you have returned to your regular daily activities without incident.  If any biopsies were taken you will be contacted by phone or by letter within the next 1-3 weeks.  Please call us at [phone] if you have not heard about the biopsies in 3 weeks.    SIGNATURES/CONFIDENTIALITY: You and/or your care partner have signed paperwork which will be entered into your electronic medical record.  These signatures attest to the fact that that the information above on your After Visit Summary has been reviewed and is understood.  Full responsibility of the confidentiality of this discharge information lies with you and/or your care-partner.  Polyps, diverticulosis, high fiber diet information given.

## 2017-01-14 NOTE — Progress Notes (Signed)
Patient awakening,vss,report to rn 

## 2017-01-14 NOTE — Progress Notes (Signed)
Pt's states no medical or surgical changes since previsit or office visit. 

## 2017-01-14 NOTE — Op Note (Signed)
[facility] Patient Name: Andrew Edwards Procedure Date: 01/14/2017 9:59 AM MRN: [id number] Endoscopist: Ladene Artist , MD Age: 51 Referring MD:  Date of Birth: [birth year] Gender: Male Account #: [id number] Procedure:                Colonoscopy Indications:              Screening for colorectal malignant neoplasm Medicines:                Monitored Anesthesia Care Procedure:                Pre-Anesthesia Assessment:                           - Prior to the procedure, a History and Physical                            was performed, and patient medications and                            allergies were reviewed. The patient's tolerance of                            previous anesthesia was also reviewed. The risks                            and benefits of the procedure and the sedation                            options and risks were discussed with the patient.                            All questions were answered, and informed consent                            was obtained. Prior Anticoagulants: The patient has                            taken no previous anticoagulant or antiplatelet                            agents. ASA Grade Assessment: II - A patient with                            mild systemic disease. After reviewing the risks                            and benefits, the patient was deemed in                            satisfactory condition to undergo the procedure.                           After obtaining informed consent, the colonoscope  was passed under direct vision. Throughout the                            procedure, the patient's blood pressure, pulse, and                            oxygen saturations were monitored continuously. The                            Model PCF-[id number]) scope was introduced                            through the anus and advanced to the the cecum,                            identified by  appendiceal orifice and ileocecal                            valve. The ileocecal valve, appendiceal orifice,                            and rectum were photographed. The quality of the                            bowel preparation was good. The patient tolerated                            the procedure well. The colonoscopy was somewhat                            difficult due to significant looping and a tortuous                            colon. Successful completion of the procedure was                            aided by changing the patient's position,                            withdrawing and reinserting the scope,                            straightening and shortening the scope to obtain                            bowel loop reduction and applying abdominal                            pressure. Scope In: 10:19:20 AM Scope Out: 10:40:41 AM Scope Withdrawal Time: 0 hours 13 minutes 8 seconds  Total Procedure Duration: 0 hours 21 minutes 21 seconds  Findings:                 The perianal and digital rectal examinations were  normal.                           Four sessile polyps were found in the sigmoid colon                            (1), descending colon (1) and transverse colon (2).                            The polyps were 6 to 7 mm in size. These polyps                            were removed with a cold snare. Resection and                            retrieval were complete.                           Scattered small-mouthed diverticula were found in                            the descending colon, transverse colon and cecum.                            There was no evidence of diverticular bleeding.                           The exam was otherwise without abnormality on                            direct and retroflexion views. Complications:            No immediate complications. Estimated blood loss:                            None. Estimated Blood  Loss:     Estimated blood loss: none. Impression:               - Four 6 to 7 mm polyps in the sigmoid colon, in                            the descending colon and in the transverse colon,                            removed with a cold snare. Resected and retrieved.                           - Mild diverticulosis in the descending colon, in                            the transverse colon and in the cecum. There was no                            evidence of diverticular bleeding.                           -  The examination was otherwise normal on direct                            and retroflexion views. Recommendation:           - Repeat colonoscopy in 5 years for surveillance if                            polyp(s) are precancerous, otherwise 10 years for                            screening.                           - Patient has a contact number available for                            emergencies. The signs and symptoms of potential                            delayed complications were discussed with the                            patient. Return to normal activities tomorrow.                            Written discharge instructions were provided to the                            patient.                           - High fiber diet.                           - Continue present medications.                           - Await pathology results. Ladene Artist, MD 01/14/2017 10:45:34 AM This report has been signed electronically.

## 2017-01-14 NOTE — Progress Notes (Signed)
Called to room to assist during endoscopic procedure.  Patient ID and intended procedure confirmed with present staff. Received instructions for my participation in the procedure from the performing physician.  

## 2017-01-17 ENCOUNTER — Telehealth: Payer: Self-pay | Admitting: *Deleted

## 2017-01-17 NOTE — Telephone Encounter (Signed)
  Follow up Call-  No flowsheet data found.  [phone] Patient questions:  Do you have a fever, pain , or abdominal swelling? No. Pain Score  0 *  Have you tolerated food without any problems? Yes.    Have you been able to return to your normal activities? Yes.    Do you have any questions about your discharge instructions: Diet   No. Medications  No. Follow up visit  No.  Do you have questions or concerns about your Care? No.  Actions: * If pain score is 4 or above: No action needed, pain <4.

## 2017-01-24 ENCOUNTER — Encounter: Payer: Self-pay | Admitting: Gastroenterology

## 2017-02-15 DIAGNOSIS — E119 Type 2 diabetes mellitus without complications: Secondary | ICD-10-CM | POA: Diagnosis not present

## 2017-02-15 DIAGNOSIS — Z7984 Long term (current) use of oral hypoglycemic drugs: Secondary | ICD-10-CM | POA: Diagnosis not present

## 2017-02-28 DIAGNOSIS — H5213 Myopia, bilateral: Secondary | ICD-10-CM | POA: Diagnosis not present

## 2017-08-24 DIAGNOSIS — E782 Mixed hyperlipidemia: Secondary | ICD-10-CM | POA: Diagnosis not present

## 2017-08-24 DIAGNOSIS — J309 Allergic rhinitis, unspecified: Secondary | ICD-10-CM | POA: Diagnosis not present

## 2017-08-24 DIAGNOSIS — E1165 Type 2 diabetes mellitus with hyperglycemia: Secondary | ICD-10-CM | POA: Diagnosis not present

## 2017-08-24 DIAGNOSIS — Z8042 Family history of malignant neoplasm of prostate: Secondary | ICD-10-CM | POA: Diagnosis not present

## 2017-08-24 DIAGNOSIS — Z0000 Encounter for general adult medical examination without abnormal findings: Secondary | ICD-10-CM | POA: Diagnosis not present

## 2017-08-24 DIAGNOSIS — Z7984 Long term (current) use of oral hypoglycemic drugs: Secondary | ICD-10-CM | POA: Diagnosis not present

## 2018-02-15 DIAGNOSIS — J309 Allergic rhinitis, unspecified: Secondary | ICD-10-CM | POA: Diagnosis not present

## 2018-02-15 DIAGNOSIS — Z8042 Family history of malignant neoplasm of prostate: Secondary | ICD-10-CM | POA: Diagnosis not present

## 2018-02-15 DIAGNOSIS — E782 Mixed hyperlipidemia: Secondary | ICD-10-CM | POA: Diagnosis not present

## 2018-02-15 DIAGNOSIS — Z0000 Encounter for general adult medical examination without abnormal findings: Secondary | ICD-10-CM | POA: Diagnosis not present

## 2018-02-15 DIAGNOSIS — E119 Type 2 diabetes mellitus without complications: Secondary | ICD-10-CM | POA: Diagnosis not present

## 2018-02-16 DIAGNOSIS — E119 Type 2 diabetes mellitus without complications: Secondary | ICD-10-CM | POA: Diagnosis not present

## 2018-02-16 DIAGNOSIS — Z7984 Long term (current) use of oral hypoglycemic drugs: Secondary | ICD-10-CM | POA: Diagnosis not present

## 2018-02-16 DIAGNOSIS — E782 Mixed hyperlipidemia: Secondary | ICD-10-CM | POA: Diagnosis not present

## 2018-03-23 DIAGNOSIS — H52223 Regular astigmatism, bilateral: Secondary | ICD-10-CM | POA: Diagnosis not present

## 2018-03-23 DIAGNOSIS — H5213 Myopia, bilateral: Secondary | ICD-10-CM | POA: Diagnosis not present

## 2018-09-07 DIAGNOSIS — J309 Allergic rhinitis, unspecified: Secondary | ICD-10-CM | POA: Diagnosis not present

## 2018-09-07 DIAGNOSIS — Z0000 Encounter for general adult medical examination without abnormal findings: Secondary | ICD-10-CM | POA: Diagnosis not present

## 2018-09-07 DIAGNOSIS — Z8042 Family history of malignant neoplasm of prostate: Secondary | ICD-10-CM | POA: Diagnosis not present

## 2018-09-07 DIAGNOSIS — E119 Type 2 diabetes mellitus without complications: Secondary | ICD-10-CM | POA: Diagnosis not present

## 2018-09-07 DIAGNOSIS — E782 Mixed hyperlipidemia: Secondary | ICD-10-CM | POA: Diagnosis not present

## 2018-09-11 DIAGNOSIS — E782 Mixed hyperlipidemia: Secondary | ICD-10-CM | POA: Diagnosis not present

## 2018-09-11 DIAGNOSIS — J309 Allergic rhinitis, unspecified: Secondary | ICD-10-CM | POA: Diagnosis not present

## 2018-09-11 DIAGNOSIS — Z0000 Encounter for general adult medical examination without abnormal findings: Secondary | ICD-10-CM | POA: Diagnosis not present

## 2018-09-11 DIAGNOSIS — E669 Obesity, unspecified: Secondary | ICD-10-CM | POA: Diagnosis not present

## 2018-09-11 DIAGNOSIS — E1169 Type 2 diabetes mellitus with other specified complication: Secondary | ICD-10-CM | POA: Diagnosis not present

## 2018-12-28 DIAGNOSIS — R05 Cough: Secondary | ICD-10-CM | POA: Diagnosis not present

## 2018-12-28 DIAGNOSIS — R062 Wheezing: Secondary | ICD-10-CM | POA: Diagnosis not present

## 2019-01-03 DIAGNOSIS — R05 Cough: Secondary | ICD-10-CM | POA: Diagnosis not present

## 2019-01-03 DIAGNOSIS — J029 Acute pharyngitis, unspecified: Secondary | ICD-10-CM | POA: Diagnosis not present

## 2019-01-03 DIAGNOSIS — E1169 Type 2 diabetes mellitus with other specified complication: Secondary | ICD-10-CM | POA: Diagnosis not present

## 2019-02-28 DIAGNOSIS — E1169 Type 2 diabetes mellitus with other specified complication: Secondary | ICD-10-CM | POA: Diagnosis not present

## 2019-03-20 DIAGNOSIS — E782 Mixed hyperlipidemia: Secondary | ICD-10-CM | POA: Diagnosis not present

## 2019-03-20 DIAGNOSIS — Z0000 Encounter for general adult medical examination without abnormal findings: Secondary | ICD-10-CM | POA: Diagnosis not present

## 2019-03-20 DIAGNOSIS — R05 Cough: Secondary | ICD-10-CM | POA: Diagnosis not present

## 2019-03-20 DIAGNOSIS — J029 Acute pharyngitis, unspecified: Secondary | ICD-10-CM | POA: Diagnosis not present

## 2019-03-20 DIAGNOSIS — Z8042 Family history of malignant neoplasm of prostate: Secondary | ICD-10-CM | POA: Diagnosis not present

## 2019-03-20 DIAGNOSIS — E1169 Type 2 diabetes mellitus with other specified complication: Secondary | ICD-10-CM | POA: Diagnosis not present

## 2019-03-21 ENCOUNTER — Other Ambulatory Visit: Payer: Self-pay | Admitting: Pharmacist

## 2019-08-23 DIAGNOSIS — H5213 Myopia, bilateral: Secondary | ICD-10-CM | POA: Diagnosis not present

## 2019-08-23 DIAGNOSIS — H52223 Regular astigmatism, bilateral: Secondary | ICD-10-CM | POA: Diagnosis not present

## 2019-09-19 DIAGNOSIS — Z0000 Encounter for general adult medical examination without abnormal findings: Secondary | ICD-10-CM | POA: Diagnosis not present

## 2019-09-19 DIAGNOSIS — E782 Mixed hyperlipidemia: Secondary | ICD-10-CM | POA: Diagnosis not present

## 2019-09-19 DIAGNOSIS — R05 Cough: Secondary | ICD-10-CM | POA: Diagnosis not present

## 2019-09-19 DIAGNOSIS — J029 Acute pharyngitis, unspecified: Secondary | ICD-10-CM | POA: Diagnosis not present

## 2019-09-19 DIAGNOSIS — Z8042 Family history of malignant neoplasm of prostate: Secondary | ICD-10-CM | POA: Diagnosis not present

## 2019-09-19 DIAGNOSIS — E1169 Type 2 diabetes mellitus with other specified complication: Secondary | ICD-10-CM | POA: Diagnosis not present

## 2019-09-27 DIAGNOSIS — E782 Mixed hyperlipidemia: Secondary | ICD-10-CM | POA: Diagnosis not present

## 2019-09-27 DIAGNOSIS — J309 Allergic rhinitis, unspecified: Secondary | ICD-10-CM | POA: Diagnosis not present

## 2019-09-27 DIAGNOSIS — E1169 Type 2 diabetes mellitus with other specified complication: Secondary | ICD-10-CM | POA: Diagnosis not present

## 2019-09-27 DIAGNOSIS — Z0000 Encounter for general adult medical examination without abnormal findings: Secondary | ICD-10-CM | POA: Diagnosis not present

## 2020-03-20 ENCOUNTER — Other Ambulatory Visit (HOSPITAL_COMMUNITY): Payer: Self-pay | Admitting: Family Medicine

## 2020-03-25 DIAGNOSIS — E782 Mixed hyperlipidemia: Secondary | ICD-10-CM | POA: Diagnosis not present

## 2020-03-25 DIAGNOSIS — J309 Allergic rhinitis, unspecified: Secondary | ICD-10-CM | POA: Diagnosis not present

## 2020-03-25 DIAGNOSIS — Z0000 Encounter for general adult medical examination without abnormal findings: Secondary | ICD-10-CM | POA: Diagnosis not present

## 2020-03-25 DIAGNOSIS — E1169 Type 2 diabetes mellitus with other specified complication: Secondary | ICD-10-CM | POA: Diagnosis not present

## 2020-03-27 ENCOUNTER — Other Ambulatory Visit (HOSPITAL_COMMUNITY): Payer: Self-pay | Admitting: Family Medicine

## 2020-03-27 DIAGNOSIS — R030 Elevated blood-pressure reading, without diagnosis of hypertension: Secondary | ICD-10-CM | POA: Diagnosis not present

## 2020-03-27 DIAGNOSIS — E782 Mixed hyperlipidemia: Secondary | ICD-10-CM | POA: Diagnosis not present

## 2020-03-27 DIAGNOSIS — E1169 Type 2 diabetes mellitus with other specified complication: Secondary | ICD-10-CM | POA: Diagnosis not present

## 2020-03-27 DIAGNOSIS — Z0000 Encounter for general adult medical examination without abnormal findings: Secondary | ICD-10-CM | POA: Diagnosis not present

## 2020-08-26 DIAGNOSIS — H5213 Myopia, bilateral: Secondary | ICD-10-CM | POA: Diagnosis not present

## 2020-08-26 DIAGNOSIS — H2513 Age-related nuclear cataract, bilateral: Secondary | ICD-10-CM | POA: Diagnosis not present

## 2020-08-26 DIAGNOSIS — E119 Type 2 diabetes mellitus without complications: Secondary | ICD-10-CM | POA: Diagnosis not present

## 2020-08-26 DIAGNOSIS — H0288B Meibomian gland dysfunction left eye, upper and lower eyelids: Secondary | ICD-10-CM | POA: Diagnosis not present

## 2020-08-26 DIAGNOSIS — H1045 Other chronic allergic conjunctivitis: Secondary | ICD-10-CM | POA: Diagnosis not present

## 2020-08-26 DIAGNOSIS — H0288A Meibomian gland dysfunction right eye, upper and lower eyelids: Secondary | ICD-10-CM | POA: Diagnosis not present

## 2020-08-26 DIAGNOSIS — H35413 Lattice degeneration of retina, bilateral: Secondary | ICD-10-CM | POA: Diagnosis not present

## 2020-08-26 DIAGNOSIS — H52223 Regular astigmatism, bilateral: Secondary | ICD-10-CM | POA: Diagnosis not present

## 2020-09-26 DIAGNOSIS — Z0000 Encounter for general adult medical examination without abnormal findings: Secondary | ICD-10-CM | POA: Diagnosis not present

## 2020-09-26 DIAGNOSIS — Z125 Encounter for screening for malignant neoplasm of prostate: Secondary | ICD-10-CM | POA: Diagnosis not present

## 2020-09-26 DIAGNOSIS — R030 Elevated blood-pressure reading, without diagnosis of hypertension: Secondary | ICD-10-CM | POA: Diagnosis not present

## 2020-09-26 DIAGNOSIS — E782 Mixed hyperlipidemia: Secondary | ICD-10-CM | POA: Diagnosis not present

## 2020-09-26 DIAGNOSIS — E1169 Type 2 diabetes mellitus with other specified complication: Secondary | ICD-10-CM | POA: Diagnosis not present

## 2020-09-26 DIAGNOSIS — R946 Abnormal results of thyroid function studies: Secondary | ICD-10-CM | POA: Diagnosis not present

## 2020-10-01 ENCOUNTER — Other Ambulatory Visit (HOSPITAL_COMMUNITY): Payer: Self-pay | Admitting: Family Medicine

## 2020-10-01 DIAGNOSIS — J309 Allergic rhinitis, unspecified: Secondary | ICD-10-CM | POA: Diagnosis not present

## 2020-10-01 DIAGNOSIS — E559 Vitamin D deficiency, unspecified: Secondary | ICD-10-CM | POA: Diagnosis not present

## 2020-10-01 DIAGNOSIS — E782 Mixed hyperlipidemia: Secondary | ICD-10-CM | POA: Diagnosis not present

## 2020-10-01 DIAGNOSIS — Z0000 Encounter for general adult medical examination without abnormal findings: Secondary | ICD-10-CM | POA: Diagnosis not present

## 2020-10-01 DIAGNOSIS — E1169 Type 2 diabetes mellitus with other specified complication: Secondary | ICD-10-CM | POA: Diagnosis not present

## 2020-10-01 DIAGNOSIS — R946 Abnormal results of thyroid function studies: Secondary | ICD-10-CM | POA: Diagnosis not present

## 2020-10-02 ENCOUNTER — Other Ambulatory Visit: Payer: Self-pay | Admitting: Family Medicine

## 2020-10-02 DIAGNOSIS — R946 Abnormal results of thyroid function studies: Secondary | ICD-10-CM

## 2020-10-07 ENCOUNTER — Other Ambulatory Visit (HOSPITAL_COMMUNITY): Payer: Self-pay | Admitting: Family Medicine

## 2020-10-08 ENCOUNTER — Ambulatory Visit
Admission: RE | Admit: 2020-10-08 | Discharge: 2020-10-08 | Disposition: A | Discharge status: Home / Self Care | Payer: 59 | Source: Ambulatory Visit | Attending: Family Medicine | Admitting: Family Medicine

## 2020-10-08 DIAGNOSIS — E042 Nontoxic multinodular goiter: Secondary | ICD-10-CM | POA: Diagnosis not present

## 2020-10-08 DIAGNOSIS — R946 Abnormal results of thyroid function studies: Secondary | ICD-10-CM

## 2021-02-11 ENCOUNTER — Other Ambulatory Visit (HOSPITAL_COMMUNITY): Payer: Self-pay

## 2021-02-11 MED FILL — Metformin HCl Tab 500 MG: ORAL | 90 days supply | Qty: 180 | Fill #0 | Status: AC

## 2021-03-14 ENCOUNTER — Emergency Department (HOSPITAL_BASED_OUTPATIENT_CLINIC_OR_DEPARTMENT_OTHER)
Admission: EM | Admit: 2021-03-14 | Discharge: 2021-03-14 | Disposition: A | Discharge status: Home / Self Care | Payer: 59 | Attending: Emergency Medicine | Admitting: Emergency Medicine

## 2021-03-14 ENCOUNTER — Other Ambulatory Visit: Payer: Self-pay

## 2021-03-14 ENCOUNTER — Encounter (HOSPITAL_BASED_OUTPATIENT_CLINIC_OR_DEPARTMENT_OTHER): Payer: Self-pay

## 2021-03-14 DIAGNOSIS — Y92096 Garden or yard of other non-institutional residence as the place of occurrence of the external cause: Secondary | ICD-10-CM | POA: Insufficient documentation

## 2021-03-14 DIAGNOSIS — E119 Type 2 diabetes mellitus without complications: Secondary | ICD-10-CM | POA: Insufficient documentation

## 2021-03-14 DIAGNOSIS — Z7984 Long term (current) use of oral hypoglycemic drugs: Secondary | ICD-10-CM | POA: Diagnosis not present

## 2021-03-14 DIAGNOSIS — I10 Essential (primary) hypertension: Secondary | ICD-10-CM | POA: Insufficient documentation

## 2021-03-14 DIAGNOSIS — Y93H2 Activity, gardening and landscaping: Secondary | ICD-10-CM | POA: Insufficient documentation

## 2021-03-14 DIAGNOSIS — W278XXA Contact with other nonpowered hand tool, initial encounter: Secondary | ICD-10-CM | POA: Insufficient documentation

## 2021-03-14 DIAGNOSIS — S0181XA Laceration without foreign body of other part of head, initial encounter: Secondary | ICD-10-CM | POA: Insufficient documentation

## 2021-03-14 DIAGNOSIS — S0990XA Unspecified injury of head, initial encounter: Secondary | ICD-10-CM | POA: Diagnosis present

## 2021-03-14 HISTORY — DX: Essential (primary) hypertension: I10

## 2021-03-14 NOTE — ED Provider Notes (Signed)
[facility] EMERGENCY DEPT Provider Note   CSN: [id number] Arrival date & time: 03/14/21  1951     History Chief Complaint  Patient presents with  . Head Laceration    Andrew Edwards is a 55 y.o. male.  HPI 55 year old male presents with forehead laceration.  He was using a tree pruner and when the branch was cut the momentum caused it to come back and hit him in the left forehead.  He did not lose consciousness.  He fell to the ground and suffered no other head injury as a small laceration because there was a little bit of a gap repair.  Tetanus immunization is up-to-date.  No headache, vomiting, weakness or numbness vision changes.  No blood thinner use. This occurred about 2 hours prior to me seeing him.   Past Medical History:  Diagnosis Date  . Allergy   . Diabetes mellitus   . Diabetes type 2, controlled ([place]) 01/15/2008   Qualifier: Diagnosis of  By: Danelle Earthly CMA, Darlene    . Hypertension   . Seasonal allergies   . Wears contact lenses     Patient Active Problem List   Diagnosis Date Noted  . HYPERLIPIDEMIA 01/16/2008  . ALLERGIC RHINITIS 01/16/2008  . Diabetes type 2, controlled ([place]) 01/15/2008  . HAY FEVER 01/15/2008  . HYPERTENSION, HX OF 01/15/2008  . RECTAL BLEEDING, HX OF 01/15/2008    Past Surgical History:  Procedure Laterality Date  . COLONOSCOPY    . HERNIA REPAIR  2000   umb  . NASAL SEPTOPLASTY W/ TURBINOPLASTY Bilateral 12/19/2014   Procedure: NASAL SEPTOPLASTY WITH TURBINATE REDUCTION;  Surgeon: Rozetta Nunnery, MD;  Location: [facility];  Service: ENT;  Laterality: Bilateral;  . TONSILLECTOMY         Family History  Problem Relation Age of Onset  . COPD Other   . Colon cancer Paternal Grandfather 52    Social History   Tobacco Use  . Smoking status: Never Smoker  . Smokeless tobacco: Never Used  Substance Use Topics  . Alcohol use: No  . Drug use: No    Home Medications Prior to Admission  medications   Medication Sig Start Date End Date Taking? Authorizing Provider  cetirizine (ZYRTEC) 10 MG tablet Take 10 mg by mouth daily.    [provider]  fluticasone (FLONASE) 50 MCG/ACT nasal spray Place 2 sprays into both nostrils daily.    [provider]  fluticasone (FLONASE) 50 MCG/ACT nasal spray INSTILL 1 TO 2 SPRAYS IN [facility] NOSTRIL ONCE A DAY 10/07/20 10/07/21  Lawerance Cruel, MD  fluticasone [facility]) 50 MCG/ACT nasal spray PLACE 1-2 SPRAY(S) IN [facility] NOSTRIL ONCE A DAY 10/01/20 10/01/21  Lawerance Cruel, MD  glucose blood test strip USE TO CHECK BLOOD SUGAR ONCE A DAY AS DIRECTED 03/20/20 03/20/21  Lawerance Cruel, MD  Lancets (FREESTYLE) lancets USE AS DIRECTED TO CHECK BLOOD SUGAR ONCE DAILY 03/20/20 03/20/21  Lawerance Cruel, MD  metFORMIN (GLUCOPHAGE) 500 MG tablet Take 500 mg by mouth 2 (two) times daily with a meal.    [provider]  metFORMIN (GLUCOPHAGE) 500 MG tablet TAKE 1 TABLET BY MOUTH TWO TIMES DAILY 03/27/20 05/12/21  Lawerance Cruel, MD  rosuvastatin (CRESTOR) 5 MG tablet TAKE 1 TABLET BY MOUTH ONCE DAILY 03/27/20 03/27/21  Lawerance Cruel, MD    Allergies    Patient has no known allergies.  Review of Systems   Review of Systems  Eyes: Negative  for visual disturbance.  Gastrointestinal: Negative for nausea and vomiting.  Neurological: Negative for weakness, numbness and headaches.    Physical Exam Updated Vital Signs BP (!) 156/96 (BP Location: Left Arm)   Pulse 93   Temp 98.1 F (36.7 C) (Oral)   Resp 17   Ht 6\' 1"  (1.854 m)   Wt 109 kg   SpO2 97%   BMI 31.70 kg/m   Physical Exam Vitals and nursing note reviewed.  Constitutional:      Appearance: He is well-developed.  HENT:     Head: Normocephalic. Laceration present.      Comments: ~0.5 cm laceration. There is another 1 cm abrasion/skin loss without laceration.     Right Ear: External ear normal.     Left Ear: External ear normal.     Nose: Nose normal.   Eyes:     General:        Right eye: No discharge.        Left eye: No discharge.     Extraocular Movements: Extraocular movements intact.     Pupils: Pupils are equal, round, and reactive to light.  Cardiovascular:     Rate and Rhythm: Normal rate and regular rhythm.     Heart sounds: Normal heart sounds.  Pulmonary:     Effort: Pulmonary effort is normal.  Abdominal:     General: There is no distension.  Musculoskeletal:     Cervical back: Neck supple.  Skin:    General: Skin is warm and dry.  Neurological:     Mental Status: He is alert.     Comments: CN 3-12 grossly intact. 5/5 strength in all 4 extremities. Grossly normal sensation. Normal finger to nose.   Psychiatric:        Mood and Affect: Mood is not anxious.     ED Results / Procedures / Treatments   Labs (all labs ordered are listed, but only abnormal results are displayed) Labs Reviewed - No data to display  EKG None  Radiology No results found.  Procedures .Marland KitchenLaceration Repair  Date/Time: 03/14/2021 9:54 PM Performed by: Sherwood Gambler, MD Authorized by: Sherwood Gambler, MD   Consent:    Consent obtained:  Verbal   Consent given by:  Patient Universal protocol:    Patient identity confirmed:  Verbally with patient Anesthesia:    Anesthesia method:  None Laceration details:    Location:  Scalp   Scalp location:  Frontal   Length (cm):  0.5 Exploration:    Limited defect created (wound extended): no   Treatment:    Amount of cleaning:  Standard   Irrigation solution:  Sterile saline   Irrigation method:  Syringe Skin repair:    Repair method:  Tissue adhesive Approximation:    Approximation:  Close Repair type:    Repair type:  Simple Post-procedure details:    Dressing:  Open (no dressing)   Procedure completion:  Tolerated well, no immediate complications     Medications Ordered in ED Medications - No data to display  ED Course  I have reviewed the triage vital signs and the  nursing notes.  Pertinent labs & imaging results that were available during my care of the patient were reviewed by me and considered in my medical decision making (see chart for details).    MDM Rules/Calculators/A&P                          Patient has small laceration.  Given history and exam I have very low suspicion for head bleed/skull fracture. No indication for CT imaging. The wound is small enough we discussed +/- of repair. He would like to glue it. It was cleaned with saline as above, then glued. Otherwise, we discussed return precautions. Stable for discharge. Final Clinical Impression(s) / ED Diagnoses Final diagnoses:  Laceration of forehead, initial encounter    Rx / DC Orders ED Discharge Orders    None       Sherwood Gambler, MD 03/14/21 2156

## 2021-03-14 NOTE — Discharge Instructions (Signed)
If you develop headache, vomiting, vision changes, weakness or numbness, or any other new/concerning symptoms then return to the ER for evaluation.

## 2021-03-14 NOTE — ED Triage Notes (Addendum)
Pt is present to the ED for a left sided forehead lac. Pt was doing yard work and was cutting his trees when the tree pruner hit him in the head. Pt denies HA, loss of consciousness and is not on blood thinners. Stable bleeding during triage.

## 2021-03-14 NOTE — ED Notes (Signed)
Work in yard this evening using tree pole pruner  States such slipped and hit his head with back of the tool.  Laceration noted above left eye  About 1 cm in size.  Denies LOC

## 2021-03-27 ENCOUNTER — Other Ambulatory Visit (HOSPITAL_COMMUNITY): Payer: Self-pay

## 2021-03-27 MED FILL — Rosuvastatin Calcium Tab 5 MG: ORAL | 90 days supply | Qty: 90 | Fill #0 | Status: AC

## 2021-04-03 ENCOUNTER — Other Ambulatory Visit (HOSPITAL_COMMUNITY): Payer: Self-pay

## 2021-04-03 MED FILL — Fluticasone Propionate Nasal Susp 50 MCG/ACT: NASAL | 90 days supply | Qty: 48 | Fill #0 | Status: AC

## 2021-05-01 ENCOUNTER — Other Ambulatory Visit (HOSPITAL_COMMUNITY): Payer: Self-pay

## 2021-05-15 ENCOUNTER — Other Ambulatory Visit (HOSPITAL_COMMUNITY): Payer: Self-pay

## 2021-05-15 MED ORDER — METFORMIN HCL 500 MG PO TABS
500.0000 mg | ORAL_TABLET | Freq: Two times a day (BID) | ORAL | 4 refills | Status: DC
  Filled 2021-05-15: qty 180, 90d supply, fill #0
  Filled 2021-08-17: qty 180, 90d supply, fill #1

## 2021-05-20 DIAGNOSIS — R030 Elevated blood-pressure reading, without diagnosis of hypertension: Secondary | ICD-10-CM | POA: Diagnosis not present

## 2021-05-20 DIAGNOSIS — E1169 Type 2 diabetes mellitus with other specified complication: Secondary | ICD-10-CM | POA: Diagnosis not present

## 2021-05-20 DIAGNOSIS — Z7984 Long term (current) use of oral hypoglycemic drugs: Secondary | ICD-10-CM | POA: Diagnosis not present

## 2021-06-26 ENCOUNTER — Other Ambulatory Visit (HOSPITAL_COMMUNITY): Payer: Self-pay

## 2021-06-26 MED ORDER — ROSUVASTATIN CALCIUM 5 MG PO TABS
5.0000 mg | ORAL_TABLET | Freq: Every day | ORAL | 1 refills | Status: DC
  Filled 2021-06-26: qty 90, 90d supply, fill #0
  Filled 2021-09-25: qty 90, 90d supply, fill #1

## 2021-06-30 ENCOUNTER — Other Ambulatory Visit (HOSPITAL_COMMUNITY): Payer: Self-pay

## 2021-07-10 ENCOUNTER — Other Ambulatory Visit (HOSPITAL_COMMUNITY): Payer: Self-pay

## 2021-07-10 MED FILL — Fluticasone Propionate Nasal Susp 50 MCG/ACT: NASAL | 90 days supply | Qty: 48 | Fill #1 | Status: AC

## 2021-08-17 ENCOUNTER — Other Ambulatory Visit (HOSPITAL_COMMUNITY): Payer: Self-pay

## 2021-09-09 DIAGNOSIS — H0288B Meibomian gland dysfunction left eye, upper and lower eyelids: Secondary | ICD-10-CM | POA: Diagnosis not present

## 2021-09-09 DIAGNOSIS — E119 Type 2 diabetes mellitus without complications: Secondary | ICD-10-CM | POA: Diagnosis not present

## 2021-09-09 DIAGNOSIS — H5213 Myopia, bilateral: Secondary | ICD-10-CM | POA: Diagnosis not present

## 2021-09-09 DIAGNOSIS — H0288A Meibomian gland dysfunction right eye, upper and lower eyelids: Secondary | ICD-10-CM | POA: Diagnosis not present

## 2021-09-09 DIAGNOSIS — H35413 Lattice degeneration of retina, bilateral: Secondary | ICD-10-CM | POA: Diagnosis not present

## 2021-09-09 DIAGNOSIS — H2512 Age-related nuclear cataract, left eye: Secondary | ICD-10-CM | POA: Diagnosis not present

## 2021-09-21 ENCOUNTER — Encounter (INDEPENDENT_AMBULATORY_CARE_PROVIDER_SITE_OTHER): Payer: Self-pay | Admitting: Ophthalmology

## 2021-09-24 ENCOUNTER — Encounter (INDEPENDENT_AMBULATORY_CARE_PROVIDER_SITE_OTHER): Payer: 59 | Admitting: Ophthalmology

## 2021-09-24 ENCOUNTER — Other Ambulatory Visit: Payer: Self-pay

## 2021-09-24 DIAGNOSIS — H35433 Paving stone degeneration of retina, bilateral: Secondary | ICD-10-CM

## 2021-09-24 DIAGNOSIS — H43813 Vitreous degeneration, bilateral: Secondary | ICD-10-CM

## 2021-09-24 DIAGNOSIS — H2513 Age-related nuclear cataract, bilateral: Secondary | ICD-10-CM | POA: Diagnosis not present

## 2021-09-25 ENCOUNTER — Other Ambulatory Visit (HOSPITAL_COMMUNITY): Payer: Self-pay

## 2021-10-06 DIAGNOSIS — E1169 Type 2 diabetes mellitus with other specified complication: Secondary | ICD-10-CM | POA: Diagnosis not present

## 2021-10-06 DIAGNOSIS — Z0000 Encounter for general adult medical examination without abnormal findings: Secondary | ICD-10-CM | POA: Diagnosis not present

## 2021-10-06 DIAGNOSIS — Z125 Encounter for screening for malignant neoplasm of prostate: Secondary | ICD-10-CM | POA: Diagnosis not present

## 2021-10-06 DIAGNOSIS — Z13220 Encounter for screening for lipoid disorders: Secondary | ICD-10-CM | POA: Diagnosis not present

## 2021-10-09 ENCOUNTER — Other Ambulatory Visit (HOSPITAL_COMMUNITY): Payer: Self-pay

## 2021-10-09 DIAGNOSIS — R3915 Urgency of urination: Secondary | ICD-10-CM | POA: Diagnosis not present

## 2021-10-09 DIAGNOSIS — E1169 Type 2 diabetes mellitus with other specified complication: Secondary | ICD-10-CM | POA: Diagnosis not present

## 2021-10-09 DIAGNOSIS — Z0000 Encounter for general adult medical examination without abnormal findings: Secondary | ICD-10-CM | POA: Diagnosis not present

## 2021-10-09 DIAGNOSIS — E782 Mixed hyperlipidemia: Secondary | ICD-10-CM | POA: Diagnosis not present

## 2021-10-09 DIAGNOSIS — R972 Elevated prostate specific antigen [PSA]: Secondary | ICD-10-CM | POA: Diagnosis not present

## 2021-10-09 DIAGNOSIS — J309 Allergic rhinitis, unspecified: Secondary | ICD-10-CM | POA: Diagnosis not present

## 2021-10-09 MED ORDER — FLUTICASONE PROPIONATE 50 MCG/ACT NA SUSP
1.0000 | Freq: Every day | NASAL | 5 refills | Status: DC
  Filled 2021-10-09: qty 48, 90d supply, fill #0
  Filled 2022-01-18: qty 48, 90d supply, fill #1
  Filled 2022-04-22: qty 48, 90d supply, fill #2
  Filled 2022-07-30: qty 48, 90d supply, fill #3

## 2021-10-09 MED ORDER — METFORMIN HCL 500 MG PO TABS
500.0000 mg | ORAL_TABLET | Freq: Two times a day (BID) | ORAL | 4 refills | Status: AC
  Filled 2021-10-09 – 2021-11-17 (×2): qty 180, 90d supply, fill #0
  Filled 2022-02-19: qty 180, 90d supply, fill #1
  Filled 2022-08-27: qty 180, 90d supply, fill #2

## 2021-10-09 MED ORDER — ROSUVASTATIN CALCIUM 5 MG PO TABS
5.0000 mg | ORAL_TABLET | Freq: Every day | ORAL | 4 refills | Status: DC
  Filled 2021-10-09 – 2021-12-30 (×2): qty 90, 90d supply, fill #0
  Filled 2022-04-01: qty 90, 90d supply, fill #1
  Filled 2022-07-02: qty 90, 90d supply, fill #2
  Filled 2022-10-07: qty 90, 90d supply, fill #3

## 2021-10-12 ENCOUNTER — Other Ambulatory Visit (HOSPITAL_COMMUNITY): Payer: Self-pay

## 2021-11-17 ENCOUNTER — Other Ambulatory Visit (HOSPITAL_COMMUNITY): Payer: Self-pay

## 2021-11-24 ENCOUNTER — Other Ambulatory Visit (HOSPITAL_COMMUNITY): Payer: Self-pay

## 2021-11-24 DIAGNOSIS — H1045 Other chronic allergic conjunctivitis: Secondary | ICD-10-CM | POA: Diagnosis not present

## 2021-11-24 DIAGNOSIS — H0288A Meibomian gland dysfunction right eye, upper and lower eyelids: Secondary | ICD-10-CM | POA: Diagnosis not present

## 2021-11-24 DIAGNOSIS — H0288B Meibomian gland dysfunction left eye, upper and lower eyelids: Secondary | ICD-10-CM | POA: Diagnosis not present

## 2021-11-24 DIAGNOSIS — H35413 Lattice degeneration of retina, bilateral: Secondary | ICD-10-CM | POA: Diagnosis not present

## 2021-11-24 DIAGNOSIS — E119 Type 2 diabetes mellitus without complications: Secondary | ICD-10-CM | POA: Diagnosis not present

## 2021-11-24 DIAGNOSIS — H2512 Age-related nuclear cataract, left eye: Secondary | ICD-10-CM | POA: Diagnosis not present

## 2021-11-24 MED ORDER — OFLOXACIN 0.3 % OP SOLN
1.0000 [drp] | Freq: Four times a day (QID) | OPHTHALMIC | 1 refills | Status: DC
  Filled 2021-11-24: qty 5, 25d supply, fill #0

## 2021-11-24 MED ORDER — PREDNISOLONE ACETATE 1 % OP SUSP
1.0000 [drp] | Freq: Four times a day (QID) | OPHTHALMIC | 1 refills | Status: DC
  Filled 2021-11-24: qty 10, 50d supply, fill #0

## 2021-11-24 MED ORDER — KETOROLAC TROMETHAMINE 0.4 % OP SOLN
1.0000 [drp] | Freq: Four times a day (QID) | OPHTHALMIC | 1 refills | Status: DC
  Filled 2021-11-24: qty 5, 25d supply, fill #0

## 2021-11-28 IMAGING — US US THYROID
1 series · 12 of 25 positions shown · non-contrast
Comparison: 05/29/2014

CLINICAL DATA: Palpable abnormality.

EXAM:
THYROID ULTRASOUND
TECHNIQUE: Ultrasound examination of the thyroid gland and adjacent soft
tissues was performed.

[Series 1: us thyroid · 0.06mm/px · 12 of 59 slices shown]
[im 3/59]
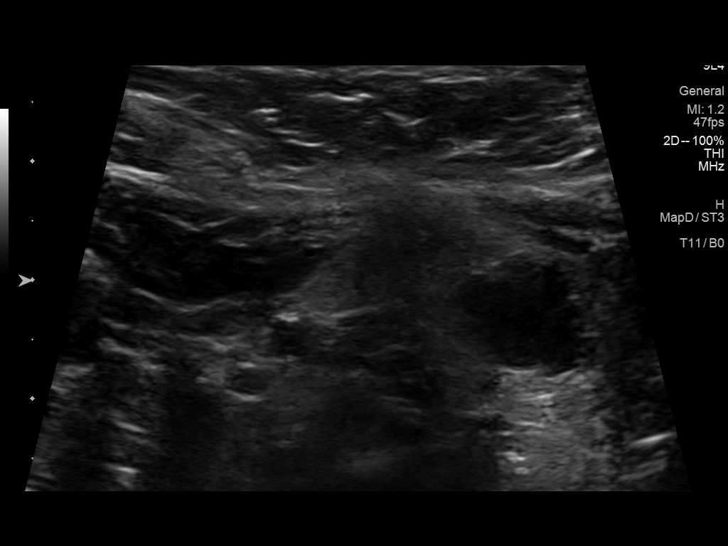
[im 8/59]
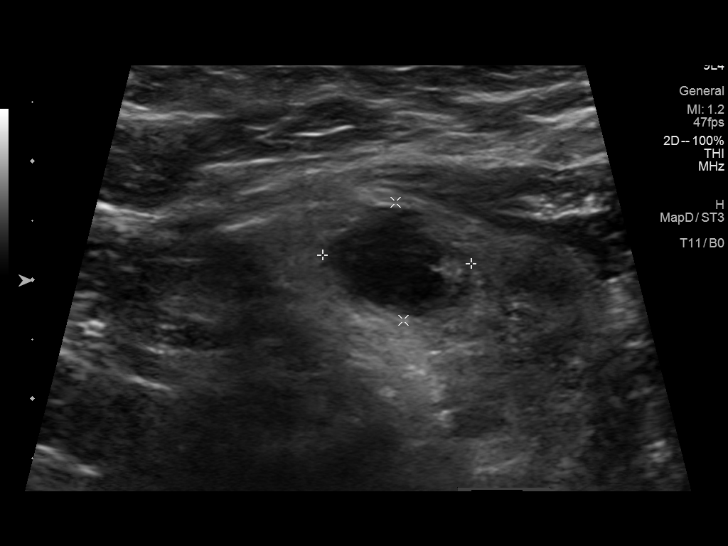
[im 13/59]
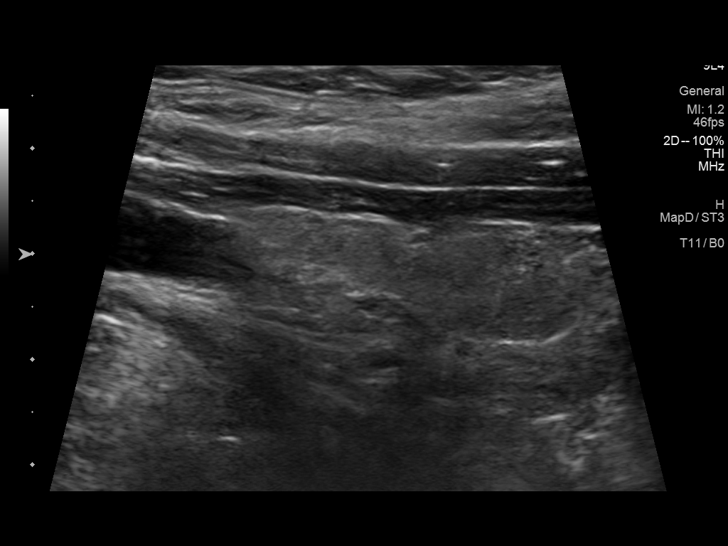
[im 17/59]
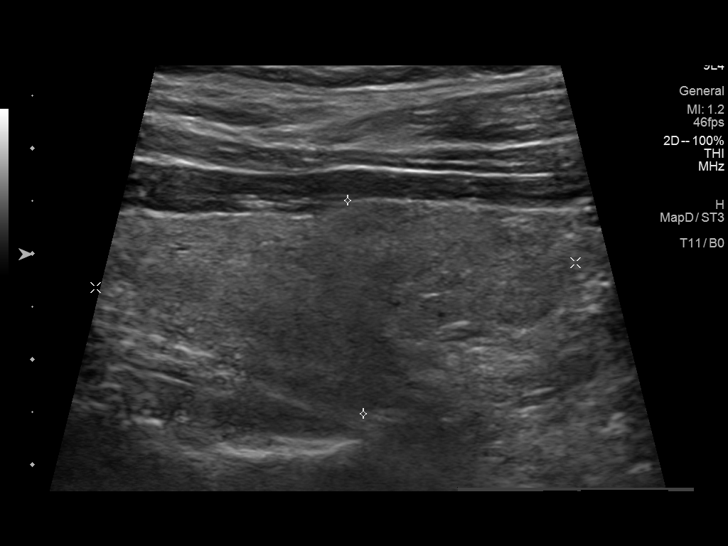
[im 22/59]
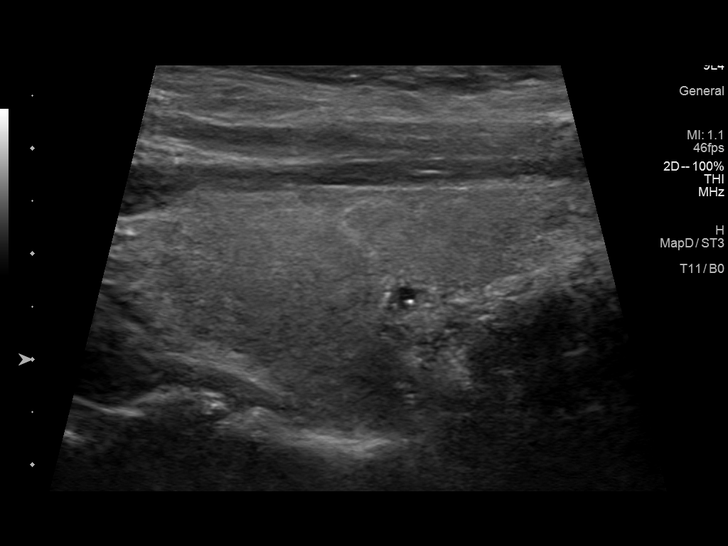
[im 27/59]
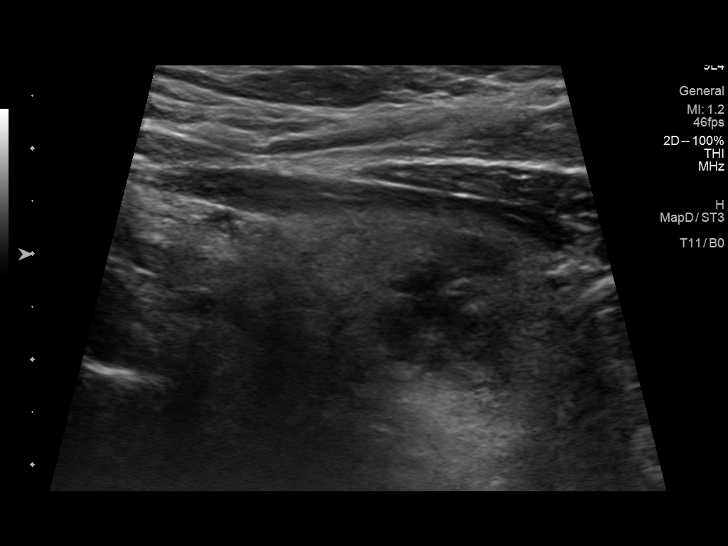
[im 32/59]
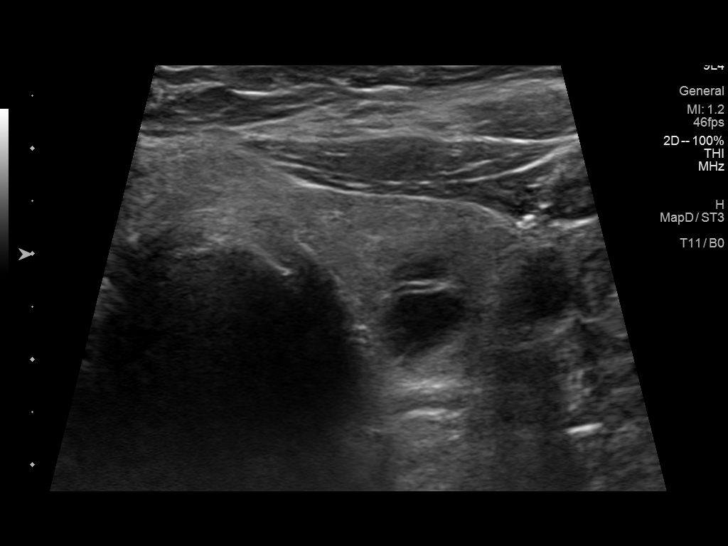
[im 37/59]
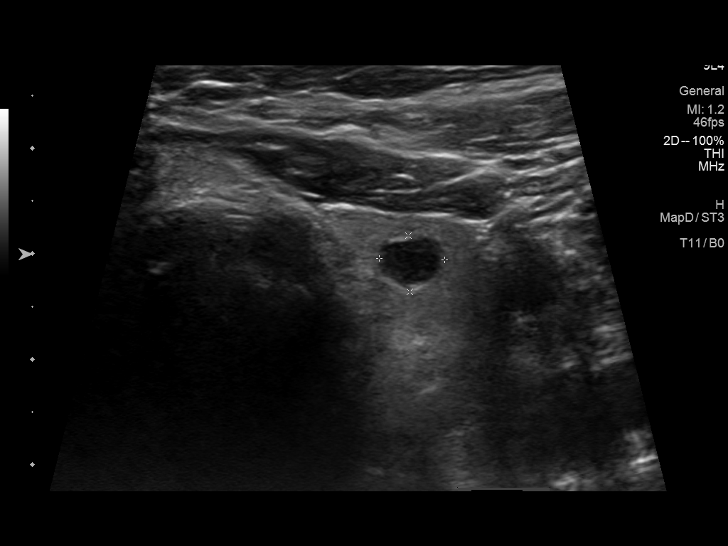
[im 42/59]
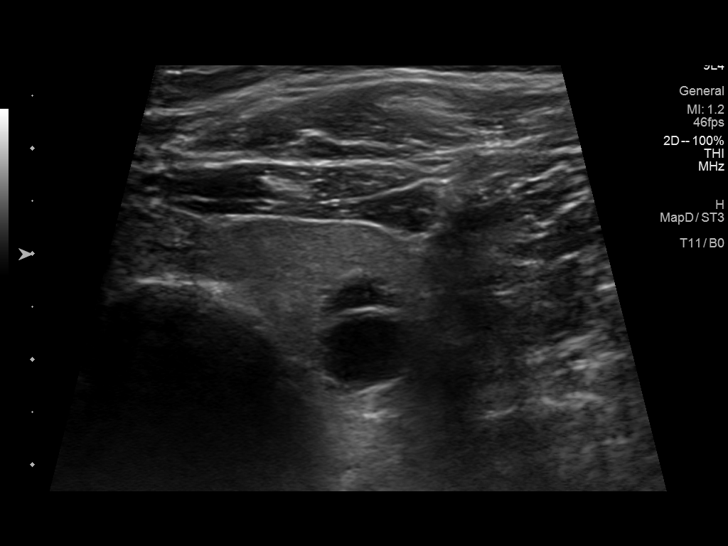
[im 46/59]
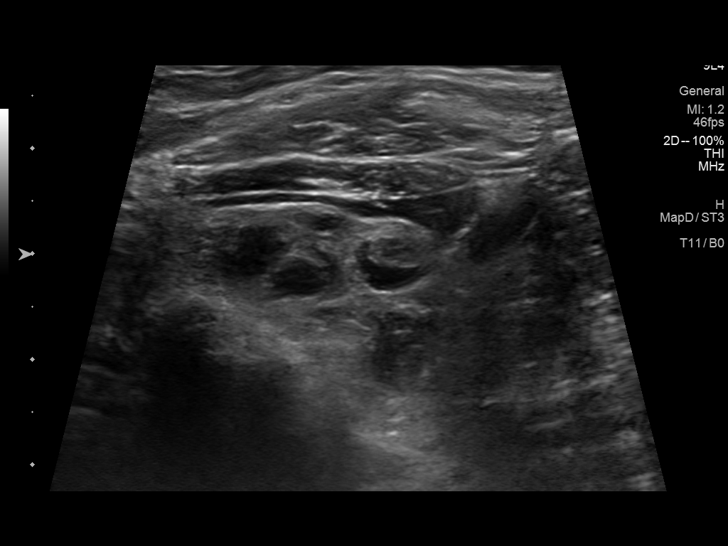
[im 51/59]
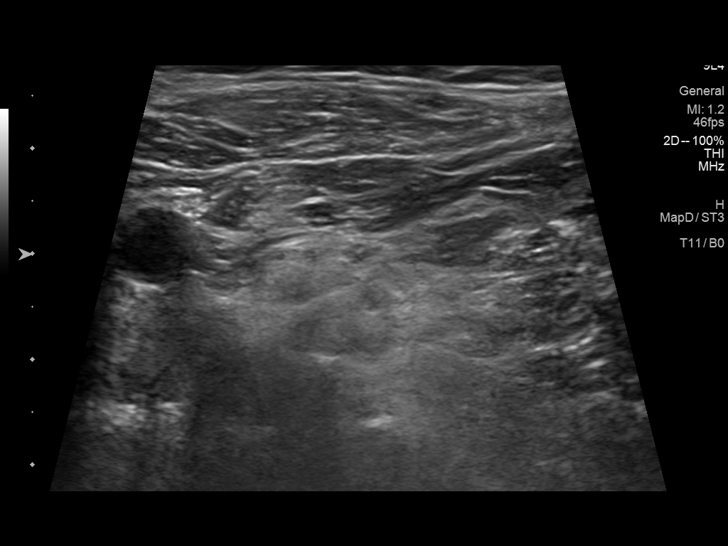
[im 56/59]
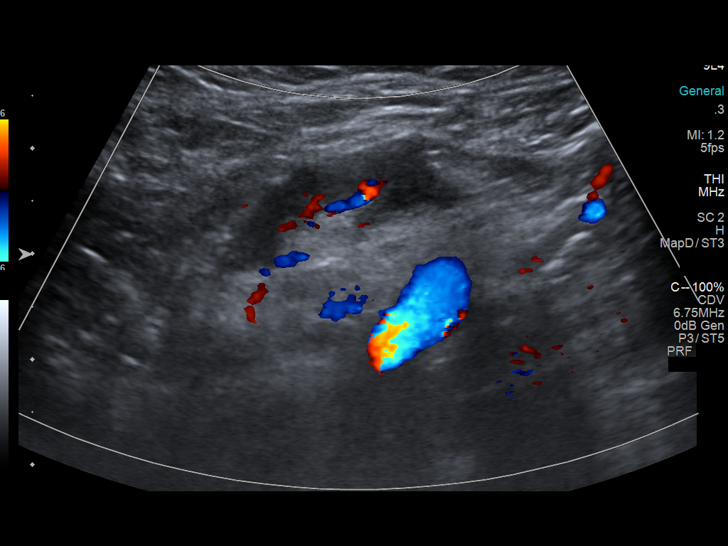

[12 of 25 positions shown; findings below may reference images not displayed]

FINDINGS: Parenchymal Echotexture: Mildly heterogenous

Isthmus: 0.6 cm, previously 0.5 cm

Right lobe: 4.6 x 2.0 x 1.7 cm, previously 5.4 x 1.4 x 1.8 cm

Left lobe: 4.3 x 1.7 x 2.1 cm, previously 5.3 x 1.7 x 2.0 cm

_________________________________________________________

Estimated total number of nodules >/= 1 cm: 3

Number of spongiform nodules >/=  2 cm not described below (TR1): 0

Number of mixed cystic and solid nodules >/= 1.5 cm not described
below (TR2): 0

_________________________________________________________

Nodule # 1:

Location: Isthmus; Inferior

Maximum size: 1.5 cm; Other 2 dimensions: 1.3 x 1.0 cm

Composition: cystic/almost completely cystic (0)

Echogenicity: anechoic (0)

Shape: not taller-than-wide (0)

Margins: smooth (0)

Echogenic foci: none (0)

ACR TI-RADS total points: 0.

ACR TI-RADS risk category: TR1 (0-1 points).

ACR TI-RADS recommendations:

This nodule does NOT meet TI-RADS criteria for biopsy or dedicated
follow-up.

_________________________________________________________

Nodule # 2:

Location: Left; Superior

Maximum size: 0.8 cm; Other 2 dimensions: 0.5 x 0.6 cm

Composition: cystic/almost completely cystic (0)

Echogenicity: anechoic (0)

Shape: not taller-than-wide (0)

Margins: smooth (0)

Echogenic foci: none (0)

ACR TI-RADS total points: 0.

ACR TI-RADS risk category: TR1 (0-1 points).

ACR TI-RADS recommendations:

This nodule does NOT meet TI-RADS criteria for biopsy or dedicated
follow-up.

_________________________________________________________

Nodule # 3:

Location: Left; Inferior

Maximum size: 1.6 cm; Other 2 dimensions: 1.2 x 1.1 cm

Composition: spongiform (0)

Echogenicity: isoechoic (1)

Shape: not taller-than-wide (0)

Margins: smooth (0)

Echogenic foci: none (0)

ACR TI-RADS total points: 1.

ACR TI-RADS risk category: TR1 (0-1 points).

ACR TI-RADS recommendations:

This nodule does NOT meet TI-RADS criteria for biopsy or dedicated
follow-up.

_________________________________________________________

Nodule # 4:

Location: Left; Inferior

Maximum size: 1.1 cm; Other 2 dimensions: 0.8 x 0.7 cm

Composition: mixed cystic and solid (1)

Echogenicity: hypoechoic (2)

Shape: not taller-than-wide (0)

Margins: smooth (0)

Echogenic foci: none (0)

ACR TI-RADS total points: 3.

ACR TI-RADS risk category: TR3 (3 points).

ACR TI-RADS recommendations:

Given size (<1.4 cm) and appearance, this nodule does NOT meet
TI-RADS criteria for biopsy or dedicated follow-up.

_________________________________________________________

Normal morphologic appearance of a right cervical lymph node. No
left cervical lymphadenopathy appreciated.
IMPRESSION: Similar appearing normal sized thyroid with several scattered cystic
nodules, each benign and not requiring dedicated ultrasound
follow-up or tissue sampling.

The above is in keeping with the ACR TI-RADS recommendations - [HOSPITAL] 8330;[DATE].

## 2021-12-04 DIAGNOSIS — H25812 Combined forms of age-related cataract, left eye: Secondary | ICD-10-CM | POA: Diagnosis not present

## 2021-12-18 DIAGNOSIS — H25811 Combined forms of age-related cataract, right eye: Secondary | ICD-10-CM | POA: Diagnosis not present

## 2021-12-30 ENCOUNTER — Other Ambulatory Visit (HOSPITAL_COMMUNITY): Payer: Self-pay

## 2022-01-08 DIAGNOSIS — R972 Elevated prostate specific antigen [PSA]: Secondary | ICD-10-CM | POA: Diagnosis not present

## 2022-01-11 ENCOUNTER — Encounter: Payer: Self-pay | Admitting: Gastroenterology

## 2022-01-18 ENCOUNTER — Other Ambulatory Visit (HOSPITAL_COMMUNITY): Payer: Self-pay

## 2022-02-19 ENCOUNTER — Other Ambulatory Visit (HOSPITAL_COMMUNITY): Payer: Self-pay

## 2022-04-01 ENCOUNTER — Other Ambulatory Visit (HOSPITAL_COMMUNITY): Payer: Self-pay

## 2022-04-12 DIAGNOSIS — R3915 Urgency of urination: Secondary | ICD-10-CM | POA: Diagnosis not present

## 2022-04-12 DIAGNOSIS — E1169 Type 2 diabetes mellitus with other specified complication: Secondary | ICD-10-CM | POA: Diagnosis not present

## 2022-04-22 ENCOUNTER — Other Ambulatory Visit (HOSPITAL_COMMUNITY): Payer: Self-pay

## 2022-04-23 ENCOUNTER — Other Ambulatory Visit (HOSPITAL_COMMUNITY): Payer: Self-pay

## 2022-05-26 ENCOUNTER — Other Ambulatory Visit (HOSPITAL_COMMUNITY): Payer: Self-pay

## 2022-05-26 MED ORDER — OZEMPIC (0.25 OR 0.5 MG/DOSE) 2 MG/3ML ~~LOC~~ SOPN
0.2500 mg | PEN_INJECTOR | SUBCUTANEOUS | 0 refills | Status: DC
  Filled 2022-05-26: qty 3, 56d supply, fill #0

## 2022-05-31 ENCOUNTER — Other Ambulatory Visit (HOSPITAL_COMMUNITY): Payer: Self-pay

## 2022-05-31 MED ORDER — METFORMIN HCL 500 MG PO TABS
500.0000 mg | ORAL_TABLET | Freq: Two times a day (BID) | ORAL | 0 refills | Status: DC
  Filled 2022-05-31: qty 180, 90d supply, fill #0

## 2022-06-07 ENCOUNTER — Other Ambulatory Visit (HOSPITAL_COMMUNITY): Payer: Self-pay

## 2022-06-07 MED ORDER — FREESTYLE LITE TEST VI STRP
ORAL_STRIP | Freq: Every day | 4 refills | Status: AC
  Filled 2022-06-07: qty 100, 90d supply, fill #0

## 2022-06-08 ENCOUNTER — Other Ambulatory Visit (HOSPITAL_COMMUNITY): Payer: Self-pay

## 2022-06-08 MED ORDER — FREESTYLE LANCETS MISC
4 refills | Status: AC
  Filled 2022-06-08: qty 100, 90d supply, fill #0

## 2022-06-25 ENCOUNTER — Other Ambulatory Visit (HOSPITAL_COMMUNITY): Payer: Self-pay

## 2022-06-26 ENCOUNTER — Other Ambulatory Visit (HOSPITAL_COMMUNITY): Payer: Self-pay

## 2022-06-26 MED ORDER — OZEMPIC (0.25 OR 0.5 MG/DOSE) 2 MG/3ML ~~LOC~~ SOPN
0.2500 mg | PEN_INJECTOR | SUBCUTANEOUS | 0 refills | Status: DC
  Filled 2022-07-08 – 2022-07-09 (×3): qty 3, 56d supply, fill #0

## 2022-06-28 ENCOUNTER — Other Ambulatory Visit (HOSPITAL_COMMUNITY): Payer: Self-pay

## 2022-06-29 ENCOUNTER — Other Ambulatory Visit (HOSPITAL_COMMUNITY): Payer: Self-pay

## 2022-06-29 MED ORDER — OZEMPIC (0.25 OR 0.5 MG/DOSE) 2 MG/3ML ~~LOC~~ SOPN
0.2500 mg | PEN_INJECTOR | SUBCUTANEOUS | 0 refills | Status: DC
  Filled 2022-06-29 – 2022-07-12 (×2): qty 3, 56d supply, fill #0

## 2022-07-02 ENCOUNTER — Other Ambulatory Visit (HOSPITAL_COMMUNITY): Payer: Self-pay

## 2022-07-05 ENCOUNTER — Other Ambulatory Visit (HOSPITAL_COMMUNITY): Payer: Self-pay

## 2022-07-05 MED ORDER — OMRON 3 SERIES BP MONITOR DEVI
0 refills | Status: DC
  Filled 2022-07-05: qty 1, 1d supply, fill #0

## 2022-07-08 ENCOUNTER — Other Ambulatory Visit (HOSPITAL_COMMUNITY): Payer: Self-pay

## 2022-07-08 DIAGNOSIS — R058 Other specified cough: Secondary | ICD-10-CM | POA: Diagnosis not present

## 2022-07-08 DIAGNOSIS — R0981 Nasal congestion: Secondary | ICD-10-CM | POA: Diagnosis not present

## 2022-07-08 DIAGNOSIS — Z6830 Body mass index (BMI) 30.0-30.9, adult: Secondary | ICD-10-CM | POA: Diagnosis not present

## 2022-07-08 MED ORDER — AMOXICILLIN-POT CLAVULANATE 875-125 MG PO TABS
1.0000 | ORAL_TABLET | Freq: Two times a day (BID) | ORAL | 0 refills | Status: DC
  Filled 2022-07-08: qty 14, 7d supply, fill #0

## 2022-07-09 ENCOUNTER — Other Ambulatory Visit (HOSPITAL_COMMUNITY): Payer: Self-pay

## 2022-07-12 ENCOUNTER — Other Ambulatory Visit (HOSPITAL_COMMUNITY): Payer: Self-pay

## 2022-07-19 DIAGNOSIS — H1045 Other chronic allergic conjunctivitis: Secondary | ICD-10-CM | POA: Diagnosis not present

## 2022-07-19 DIAGNOSIS — H0288B Meibomian gland dysfunction left eye, upper and lower eyelids: Secondary | ICD-10-CM | POA: Diagnosis not present

## 2022-07-19 DIAGNOSIS — H0288A Meibomian gland dysfunction right eye, upper and lower eyelids: Secondary | ICD-10-CM | POA: Diagnosis not present

## 2022-07-19 DIAGNOSIS — E119 Type 2 diabetes mellitus without complications: Secondary | ICD-10-CM | POA: Diagnosis not present

## 2022-07-19 DIAGNOSIS — Z961 Presence of intraocular lens: Secondary | ICD-10-CM | POA: Diagnosis not present

## 2022-07-19 DIAGNOSIS — H35413 Lattice degeneration of retina, bilateral: Secondary | ICD-10-CM | POA: Diagnosis not present

## 2022-07-30 ENCOUNTER — Other Ambulatory Visit (HOSPITAL_COMMUNITY): Payer: Self-pay

## 2022-08-06 ENCOUNTER — Other Ambulatory Visit (HOSPITAL_COMMUNITY): Payer: Self-pay

## 2022-08-26 ENCOUNTER — Other Ambulatory Visit (HOSPITAL_COMMUNITY): Payer: Self-pay

## 2022-08-27 ENCOUNTER — Other Ambulatory Visit (HOSPITAL_COMMUNITY): Payer: Self-pay

## 2022-08-30 ENCOUNTER — Other Ambulatory Visit (HOSPITAL_COMMUNITY): Payer: Self-pay

## 2022-08-30 MED ORDER — METFORMIN HCL 500 MG PO TABS
500.0000 mg | ORAL_TABLET | Freq: Two times a day (BID) | ORAL | 0 refills | Status: DC
  Filled 2022-08-30: qty 180, 90d supply, fill #0

## 2022-09-09 ENCOUNTER — Other Ambulatory Visit (HOSPITAL_COMMUNITY): Payer: Self-pay

## 2022-09-09 MED ORDER — OZEMPIC (0.25 OR 0.5 MG/DOSE) 2 MG/3ML ~~LOC~~ SOPN
0.2500 mg | PEN_INJECTOR | SUBCUTANEOUS | 0 refills | Status: DC
  Filled 2022-09-09: qty 3, 56d supply, fill #0

## 2022-10-29 ENCOUNTER — Other Ambulatory Visit (HOSPITAL_COMMUNITY): Payer: Self-pay

## 2022-10-29 MED ORDER — FLUTICASONE PROPIONATE 50 MCG/ACT NA SUSP
1.0000 | Freq: Every day | NASAL | 1 refills | Status: DC
  Filled 2022-10-29: qty 48, 90d supply, fill #0
  Filled 2023-01-21: qty 48, 90d supply, fill #1

## 2022-11-05 ENCOUNTER — Other Ambulatory Visit (HOSPITAL_COMMUNITY): Payer: Self-pay

## 2022-11-05 DIAGNOSIS — Z125 Encounter for screening for malignant neoplasm of prostate: Secondary | ICD-10-CM | POA: Diagnosis not present

## 2022-11-05 DIAGNOSIS — Z13220 Encounter for screening for lipoid disorders: Secondary | ICD-10-CM | POA: Diagnosis not present

## 2022-11-05 DIAGNOSIS — Z0000 Encounter for general adult medical examination without abnormal findings: Secondary | ICD-10-CM | POA: Diagnosis not present

## 2022-11-05 DIAGNOSIS — E1169 Type 2 diabetes mellitus with other specified complication: Secondary | ICD-10-CM | POA: Diagnosis not present

## 2022-11-05 MED ORDER — OZEMPIC (0.25 OR 0.5 MG/DOSE) 2 MG/3ML ~~LOC~~ SOPN
0.2500 mg | PEN_INJECTOR | SUBCUTANEOUS | 0 refills | Status: DC
  Filled 2022-11-05: qty 3, 56d supply, fill #0

## 2022-11-10 ENCOUNTER — Other Ambulatory Visit (HOSPITAL_COMMUNITY): Payer: Self-pay

## 2022-11-10 DIAGNOSIS — R972 Elevated prostate specific antigen [PSA]: Secondary | ICD-10-CM | POA: Diagnosis not present

## 2022-11-10 DIAGNOSIS — E782 Mixed hyperlipidemia: Secondary | ICD-10-CM | POA: Diagnosis not present

## 2022-11-10 DIAGNOSIS — Z0000 Encounter for general adult medical examination without abnormal findings: Secondary | ICD-10-CM | POA: Diagnosis not present

## 2022-11-10 DIAGNOSIS — Z6830 Body mass index (BMI) 30.0-30.9, adult: Secondary | ICD-10-CM | POA: Diagnosis not present

## 2022-11-10 DIAGNOSIS — E559 Vitamin D deficiency, unspecified: Secondary | ICD-10-CM | POA: Diagnosis not present

## 2022-11-10 DIAGNOSIS — Z23 Encounter for immunization: Secondary | ICD-10-CM | POA: Diagnosis not present

## 2022-11-10 DIAGNOSIS — E1169 Type 2 diabetes mellitus with other specified complication: Secondary | ICD-10-CM | POA: Diagnosis not present

## 2022-11-10 MED ORDER — METFORMIN HCL 500 MG PO TABS
500.0000 mg | ORAL_TABLET | Freq: Two times a day (BID) | ORAL | 4 refills | Status: DC
  Filled 2022-11-10: qty 180, 90d supply, fill #0
  Filled 2023-02-22: qty 180, 90d supply, fill #1

## 2022-11-10 MED ORDER — OZEMPIC (0.25 OR 0.5 MG/DOSE) 2 MG/3ML ~~LOC~~ SOPN
0.2500 mg | PEN_INJECTOR | SUBCUTANEOUS | 12 refills | Status: DC
  Filled 2022-11-10 – 2023-01-25 (×6): qty 3, 56d supply, fill #0
  Filled 2023-03-24: qty 3, 56d supply, fill #1

## 2022-11-10 MED ORDER — ROSUVASTATIN CALCIUM 5 MG PO TABS
5.0000 mg | ORAL_TABLET | Freq: Every day | ORAL | 4 refills | Status: DC
  Filled 2022-11-10 – 2023-01-07 (×2): qty 90, 90d supply, fill #0
  Filled 2023-04-07: qty 90, 90d supply, fill #1
  Filled 2023-07-10: qty 90, 90d supply, fill #2
  Filled 2023-10-09: qty 90, 90d supply, fill #3

## 2022-11-11 ENCOUNTER — Other Ambulatory Visit (HOSPITAL_COMMUNITY): Payer: Self-pay

## 2022-12-31 ENCOUNTER — Other Ambulatory Visit (HOSPITAL_COMMUNITY): Payer: Self-pay

## 2023-01-03 ENCOUNTER — Other Ambulatory Visit (HOSPITAL_COMMUNITY): Payer: Self-pay

## 2023-01-05 ENCOUNTER — Other Ambulatory Visit (HOSPITAL_COMMUNITY): Payer: Self-pay

## 2023-01-06 ENCOUNTER — Other Ambulatory Visit: Payer: Self-pay

## 2023-01-06 ENCOUNTER — Other Ambulatory Visit (HOSPITAL_COMMUNITY): Payer: Self-pay

## 2023-01-07 ENCOUNTER — Other Ambulatory Visit: Payer: Self-pay

## 2023-01-07 ENCOUNTER — Other Ambulatory Visit (HOSPITAL_COMMUNITY): Payer: Self-pay

## 2023-01-13 ENCOUNTER — Other Ambulatory Visit (HOSPITAL_COMMUNITY): Payer: Self-pay

## 2023-01-14 ENCOUNTER — Other Ambulatory Visit (HOSPITAL_COMMUNITY): Payer: Self-pay

## 2023-01-19 ENCOUNTER — Other Ambulatory Visit (HOSPITAL_COMMUNITY): Payer: Self-pay

## 2023-01-19 DIAGNOSIS — R972 Elevated prostate specific antigen [PSA]: Secondary | ICD-10-CM | POA: Diagnosis not present

## 2023-01-21 ENCOUNTER — Other Ambulatory Visit (HOSPITAL_COMMUNITY): Payer: Self-pay

## 2023-01-25 ENCOUNTER — Other Ambulatory Visit (HOSPITAL_COMMUNITY): Payer: Self-pay

## 2023-01-27 ENCOUNTER — Other Ambulatory Visit (HOSPITAL_COMMUNITY): Payer: Self-pay

## 2023-02-22 ENCOUNTER — Other Ambulatory Visit (HOSPITAL_COMMUNITY): Payer: Self-pay

## 2023-04-08 ENCOUNTER — Other Ambulatory Visit (HOSPITAL_COMMUNITY): Payer: Self-pay

## 2023-04-08 MED ORDER — FLUTICASONE PROPIONATE 50 MCG/ACT NA SUSP
1.0000 | Freq: Every day | NASAL | 1 refills | Status: DC
  Filled 2023-04-08: qty 48, 90d supply, fill #0
  Filled 2023-07-22: qty 48, 90d supply, fill #1

## 2023-05-16 ENCOUNTER — Other Ambulatory Visit (HOSPITAL_COMMUNITY): Payer: Self-pay

## 2023-05-16 DIAGNOSIS — E1169 Type 2 diabetes mellitus with other specified complication: Secondary | ICD-10-CM | POA: Diagnosis not present

## 2023-05-16 DIAGNOSIS — E669 Obesity, unspecified: Secondary | ICD-10-CM | POA: Diagnosis not present

## 2023-05-16 MED ORDER — OZEMPIC (0.25 OR 0.5 MG/DOSE) 2 MG/3ML ~~LOC~~ SOPN
0.5000 mg | PEN_INJECTOR | SUBCUTANEOUS | 6 refills | Status: DC
  Filled 2023-05-16: qty 3, 28d supply, fill #0
  Filled 2023-06-11: qty 3, 28d supply, fill #1
  Filled 2023-07-15: qty 3, 28d supply, fill #2
  Filled 2023-08-08: qty 3, 28d supply, fill #3
  Filled 2023-09-04: qty 3, 28d supply, fill #4
  Filled 2023-10-06: qty 3, 28d supply, fill #5
  Filled 2023-11-08: qty 3, 28d supply, fill #6

## 2023-07-25 DIAGNOSIS — E119 Type 2 diabetes mellitus without complications: Secondary | ICD-10-CM | POA: Diagnosis not present

## 2023-07-25 DIAGNOSIS — Z961 Presence of intraocular lens: Secondary | ICD-10-CM | POA: Diagnosis not present

## 2023-10-24 ENCOUNTER — Other Ambulatory Visit (HOSPITAL_COMMUNITY): Payer: Self-pay

## 2023-10-27 ENCOUNTER — Other Ambulatory Visit (HOSPITAL_COMMUNITY): Payer: Self-pay

## 2023-10-27 ENCOUNTER — Encounter (HOSPITAL_COMMUNITY): Payer: Self-pay

## 2023-10-27 MED ORDER — FLUTICASONE PROPIONATE 50 MCG/ACT NA SUSP
1.0000 | Freq: Every day | NASAL | 1 refills | Status: DC
  Filled 2023-10-27: qty 48, 90d supply, fill #0
  Filled 2024-01-05 – 2024-01-23 (×3): qty 48, 90d supply, fill #1

## 2023-11-18 DIAGNOSIS — Z0000 Encounter for general adult medical examination without abnormal findings: Secondary | ICD-10-CM | POA: Diagnosis not present

## 2023-11-18 DIAGNOSIS — Z13220 Encounter for screening for lipoid disorders: Secondary | ICD-10-CM | POA: Diagnosis not present

## 2023-11-18 DIAGNOSIS — E1169 Type 2 diabetes mellitus with other specified complication: Secondary | ICD-10-CM | POA: Diagnosis not present

## 2023-11-18 DIAGNOSIS — Z125 Encounter for screening for malignant neoplasm of prostate: Secondary | ICD-10-CM | POA: Diagnosis not present

## 2023-11-18 DIAGNOSIS — E559 Vitamin D deficiency, unspecified: Secondary | ICD-10-CM | POA: Diagnosis not present

## 2023-11-23 ENCOUNTER — Other Ambulatory Visit (HOSPITAL_COMMUNITY): Payer: Self-pay

## 2023-11-23 DIAGNOSIS — E559 Vitamin D deficiency, unspecified: Secondary | ICD-10-CM | POA: Diagnosis not present

## 2023-11-23 DIAGNOSIS — Z0000 Encounter for general adult medical examination without abnormal findings: Secondary | ICD-10-CM | POA: Diagnosis not present

## 2023-11-23 DIAGNOSIS — E669 Obesity, unspecified: Secondary | ICD-10-CM | POA: Diagnosis not present

## 2023-11-23 DIAGNOSIS — E782 Mixed hyperlipidemia: Secondary | ICD-10-CM | POA: Diagnosis not present

## 2023-11-23 DIAGNOSIS — E1169 Type 2 diabetes mellitus with other specified complication: Secondary | ICD-10-CM | POA: Diagnosis not present

## 2023-11-23 MED ORDER — OZEMPIC (1 MG/DOSE) 4 MG/3ML ~~LOC~~ SOPN
1.0000 mg | PEN_INJECTOR | SUBCUTANEOUS | 12 refills | Status: DC
  Filled 2023-11-23 – 2023-12-08 (×2): qty 3, 28d supply, fill #0
  Filled 2024-01-05: qty 3, 28d supply, fill #1
  Filled 2024-02-02: qty 3, 28d supply, fill #2
  Filled 2024-03-01: qty 3, 28d supply, fill #3
  Filled 2024-03-30: qty 3, 28d supply, fill #4
  Filled 2024-04-26: qty 3, 28d supply, fill #5
  Filled 2024-05-22: qty 3, 28d supply, fill #6
  Filled 2024-06-19: qty 3, 28d supply, fill #7
  Filled 2024-07-20: qty 3, 28d supply, fill #8
  Filled 2024-08-14: qty 3, 28d supply, fill #9
  Filled 2024-09-12: qty 3, 28d supply, fill #10

## 2023-11-23 MED ORDER — ROSUVASTATIN CALCIUM 5 MG PO TABS
5.0000 mg | ORAL_TABLET | Freq: Every day | ORAL | 4 refills | Status: AC
  Filled 2024-01-05: qty 90, 90d supply, fill #0
  Filled 2024-04-10: qty 90, 90d supply, fill #1
  Filled 2024-07-08: qty 90, 90d supply, fill #2
  Filled 2024-10-11: qty 90, 90d supply, fill #3

## 2023-12-08 ENCOUNTER — Other Ambulatory Visit (HOSPITAL_COMMUNITY): Payer: Self-pay

## 2023-12-08 ENCOUNTER — Encounter (HOSPITAL_COMMUNITY): Payer: Self-pay

## 2024-01-06 ENCOUNTER — Other Ambulatory Visit: Payer: Self-pay

## 2024-01-06 ENCOUNTER — Other Ambulatory Visit (HOSPITAL_COMMUNITY): Payer: Self-pay

## 2024-01-10 DIAGNOSIS — R972 Elevated prostate specific antigen [PSA]: Secondary | ICD-10-CM | POA: Diagnosis not present

## 2024-04-22 ENCOUNTER — Other Ambulatory Visit (HOSPITAL_COMMUNITY): Payer: Self-pay

## 2024-04-23 ENCOUNTER — Other Ambulatory Visit (HOSPITAL_COMMUNITY): Payer: Self-pay

## 2024-04-23 MED ORDER — FLUTICASONE PROPIONATE 50 MCG/ACT NA SUSP
1.0000 | Freq: Every day | NASAL | 1 refills | Status: DC
  Filled 2024-04-23: qty 48, 90d supply, fill #0
  Filled 2024-07-08: qty 48, 90d supply, fill #1

## 2024-05-08 DIAGNOSIS — E1169 Type 2 diabetes mellitus with other specified complication: Secondary | ICD-10-CM | POA: Diagnosis not present

## 2024-05-08 DIAGNOSIS — Z6831 Body mass index (BMI) 31.0-31.9, adult: Secondary | ICD-10-CM | POA: Diagnosis not present

## 2024-07-16 ENCOUNTER — Other Ambulatory Visit (HOSPITAL_COMMUNITY): Payer: Self-pay

## 2024-08-06 DIAGNOSIS — Z961 Presence of intraocular lens: Secondary | ICD-10-CM | POA: Diagnosis not present

## 2024-08-06 DIAGNOSIS — E119 Type 2 diabetes mellitus without complications: Secondary | ICD-10-CM | POA: Diagnosis not present

## 2024-08-23 ENCOUNTER — Encounter: Payer: Self-pay | Admitting: Internal Medicine

## 2024-09-17 ENCOUNTER — Ambulatory Visit: Admitting: *Deleted

## 2024-09-17 ENCOUNTER — Other Ambulatory Visit (HOSPITAL_COMMUNITY): Payer: Self-pay

## 2024-09-17 VITALS — Ht 73.0 in | Wt 235.0 lb

## 2024-09-17 DIAGNOSIS — Z860100 Personal history of colon polyps, unspecified: Secondary | ICD-10-CM

## 2024-09-17 MED ORDER — NA SULFATE-K SULFATE-MG SULF 17.5-3.13-1.6 GM/177ML PO SOLN
1.0000 | Freq: Once | ORAL | 0 refills | Status: AC
  Filled 2024-09-17: qty 354, 1d supply, fill #0

## 2024-09-17 NOTE — Progress Notes (Signed)
 Pt's name and DOB verified at the beginning of the pre-visit with 2 identifiers  Permission given to speak with  Pt denies any difficulty with ambulating,sitting, laying down or rolling side to side  Pt has no issues moving head neck or swallowing  No egg or soy allergy known to patient   No issues known to pt with past sedation  No FH of Malignant Hyperthermia  Pt is not on home 02   Pt is not on blood thinners    Pt has frequent issues with constipation RN instructed pt to use Miralax per bottles instructions a week before prep days. Pt states they will  Pt is not on dialysis  Pt denise any abnormal heart rhythms   Pt denies any upcoming cardiac testing  Patient's chart reviewed by Norleen Schillings CNRA prior to pre-visit and patient appropriate for the [facility].  Pre-visit completed and red dot placed by patient's name on their procedure day (on provider's schedule).    Visit in person  Pt states weight is    Pt given  both [facility] main # and MD on call # prior to instructions.  Informed pt to come in at the time discussed and is shown on PV instructions.  Pt instructed to use [employer] or GoodRx for a price reduction on prep  Instructed pt where to find PV instructions in My [facility]. Copy of instructions  given to pt Instructed pt on all aspects of written instructions including med holds clothing to wear and foods to eat and not eat as well as after procedure legal restrictions and to call MD on call if needed.. Pt states understanding. Instructed pt to review instructions again prior to procedure and call main # given if has any questions or any issues. Pt states they will.

## 2024-09-17 NOTE — Addendum Note (Signed)
 Addended by: MALINDA ORIE CROME on: 09/17/2024 04:27 PM   Modules accepted: Orders

## 2024-09-24 ENCOUNTER — Encounter: Payer: Self-pay | Admitting: Internal Medicine

## 2024-10-01 ENCOUNTER — Ambulatory Visit: Admitting: Internal Medicine

## 2024-10-01 ENCOUNTER — Encounter: Payer: Self-pay | Admitting: Internal Medicine

## 2024-10-01 VITALS — BP 137/76 | HR 75 | Temp 97.3°F | Resp 12 | Ht 73.0 in | Wt 237.0 lb

## 2024-10-01 DIAGNOSIS — K648 Other hemorrhoids: Secondary | ICD-10-CM | POA: Diagnosis not present

## 2024-10-01 DIAGNOSIS — D123 Benign neoplasm of transverse colon: Secondary | ICD-10-CM

## 2024-10-01 DIAGNOSIS — E119 Type 2 diabetes mellitus without complications: Secondary | ICD-10-CM | POA: Diagnosis not present

## 2024-10-01 DIAGNOSIS — Z860100 Personal history of colon polyps, unspecified: Secondary | ICD-10-CM | POA: Diagnosis not present

## 2024-10-01 DIAGNOSIS — K5730 Diverticulosis of large intestine without perforation or abscess without bleeding: Secondary | ICD-10-CM | POA: Diagnosis not present

## 2024-10-01 DIAGNOSIS — Z1211 Encounter for screening for malignant neoplasm of colon: Secondary | ICD-10-CM | POA: Diagnosis not present

## 2024-10-01 DIAGNOSIS — Z860101 Personal history of adenomatous and serrated colon polyps: Secondary | ICD-10-CM | POA: Diagnosis not present

## 2024-10-01 DIAGNOSIS — I10 Essential (primary) hypertension: Secondary | ICD-10-CM | POA: Diagnosis not present

## 2024-10-01 HISTORY — PX: COLONOSCOPY WITH PROPOFOL: SHX5780

## 2024-10-01 MED ORDER — SODIUM CHLORIDE 0.9 % IV SOLN: 500.0000 mL | INTRAVENOUS | Status: DC

## 2024-10-01 NOTE — Progress Notes (Signed)
 Called to room to assist during endoscopic procedure.  Patient ID and intended procedure confirmed with present staff. Received instructions for my participation in the procedure from the performing physician.

## 2024-10-01 NOTE — Progress Notes (Signed)
 Transferred to PACU via stretcher.  Not responding to stimulation at this time.  VSS upon leaving procedure room.

## 2024-10-01 NOTE — Progress Notes (Signed)
 Pt's states no medical or surgical changes since previsit or office visit.

## 2024-10-01 NOTE — Op Note (Signed)
 [facility] Patient Name: Andrew Edwards Procedure Date: 10/01/2024 9:56 AM MRN: [id number] Endoscopist: Norleen SAILOR. Abran , MD, [id number] Age: 58 Referring MD:  Date of Birth: 04-Sep-1966 Gender: Male Account #: [id number] Procedure:                Colonoscopy with cold snare polypectomy x 1 Indications:              High risk colon cancer surveillance: Personal                            history of multiple sessile serrated colon polyp                            (less than 10 mm in size) with no dysplasia.                            Previous examination with Dr. Aneita 2018. Medicines:                Monitored Anesthesia Care Procedure:                Pre-Anesthesia Assessment:                           - Prior to the procedure, a History and Physical                            was performed, and patient medications and                            allergies were reviewed. The patient's tolerance of                            previous anesthesia was also reviewed. The risks                            and benefits of the procedure and the sedation                            options and risks were discussed with the patient.                            All questions were answered, and informed consent                            was obtained. Prior Anticoagulants: The patient has                            taken no anticoagulant or antiplatelet agents. ASA                            Grade Assessment: II - A patient with mild systemic                            disease. After reviewing the risks and benefits,  the patient was deemed in satisfactory condition to                            undergo the procedure.                           After obtaining informed consent, the colonoscope                            was passed under direct vision. Throughout the                            procedure, the patient's blood pressure, pulse, and                             oxygen saturations were monitored continuously. The                            Olympus CF-[id number] ([id number]) Colonoscope was                            introduced through the anus and advanced to the the                            cecum, identified by appendiceal orifice and                            ileocecal valve. The ileocecal valve, appendiceal                            orifice, and rectum were photographed. The quality                            of the bowel preparation was excellent. The                            colonoscopy was performed without difficulty. The                            patient tolerated the procedure well. The bowel                            preparation used was SUPREP via split dose                            instruction. Scope In: 10:26:23 AM Scope Out: 10:41:16 AM Scope Withdrawal Time: 0 hours 12 minutes 32 seconds  Total Procedure Duration: 0 hours 14 minutes 53 seconds  Findings:                 A 3 mm adenomatous appearing polyp was found in the                            transverse colon. The polyp was removed with a cold  snare. Resection and retrieval were complete.                           Multiple diverticula were found in the entire colon.                           Internal hemorrhoids were found during                            retroflexion. The hemorrhoids were small.                           The exam was otherwise without abnormality on                            direct and retroflexion views. Complications:            No immediate complications. Estimated blood loss:                            None. Estimated Blood Loss:     Estimated blood loss: none. Impression:               - One 3 mm polyp in the transverse colon, removed                            with a cold snare. Resected and retrieved.                           - Diverticulosis in the entire examined colon.                           - Internal  hemorrhoids.                           - The examination was otherwise normal on direct                            and retroflexion views. Recommendation:           - Repeat colonoscopy in 5 years for surveillance                            (personal history of multiple polyps).                           - Patient has a contact number available for                            emergencies. The signs and symptoms of potential                            delayed complications were discussed with the                            patient. Return to normal activities tomorrow.  Written discharge instructions were provided to the                            patient.                           - Resume previous diet.                           - Continue present medications.                           - Await pathology results. Norleen SAILOR. Abran, MD 10/01/2024 10:47:57 AM This report has been signed electronically.

## 2024-10-01 NOTE — Patient Instructions (Addendum)

## 2024-10-01 NOTE — Progress Notes (Signed)
 HISTORY OF PRESENT ILLNESS:  Andrew Edwards is a 58 y.o. male, previous patient of Dr. Aneita, with a history of multiple sessile serrated polyps.  Last colonoscopy 2018.  Presents today for surveillance colonoscopy  REVIEW OF SYSTEMS:  All non-GI ROS negative except for  Past Medical History:  Diagnosis Date   Allergy    Cataract    Diabetes mellitus    Diabetes type 2, controlled (HCC) 01/15/2008   Qualifier: Diagnosis of  By: Anice CMA, Darlene     Hypertension    Seasonal allergies    Wears contact lenses     Past Surgical History:  Procedure Laterality Date   COLONOSCOPY     HERNIA REPAIR  2000   umb   NASAL SEPTOPLASTY W/ TURBINOPLASTY Bilateral 12/19/2014   Procedure: NASAL SEPTOPLASTY WITH TURBINATE REDUCTION;  Surgeon: Lonni FORBES Angle, MD;  Location: [facility];  Service: ENT;  Laterality: Bilateral;   TONSILLECTOMY      Social History Andrew Edwards  reports that he has never smoked. He has never used smokeless tobacco. He reports that he does not drink alcohol and does not use drugs.  family history includes COPD in an other family member; Colon cancer (age of onset: 59) in his paternal grandfather.  No Known Allergies     PHYSICAL EXAMINATION: Vital signs: There were no vitals taken for this visit. General: Well-developed, well-nourished, no acute distress HEENT: Sclerae are anicteric, conjunctiva pink. Oral mucosa intact Lungs: Clear Heart: Regular Abdomen: soft, nontender, nondistended, no obvious ascites, no peritoneal signs, normal bowel sounds. No organomegaly. Extremities: No edema Psychiatric: alert and oriented x3. Cooperative     ASSESSMENT:  History of sessile serrated polyps   PLAN:  Surveillance colonoscopy

## 2024-10-02 ENCOUNTER — Telehealth: Payer: Self-pay

## 2024-10-02 NOTE — Telephone Encounter (Signed)
  Follow up Call-     10/01/2024   10:02 AM  Call back number  Post procedure Call Back phone  # [phone]  Permission to leave phone message Yes     Patient questions:  Do you have a fever, pain , or abdominal swelling? No. Pain Score  0 *  Have you tolerated food without any problems? Yes.    Have you been able to return to your normal activities? No.  Do you have any questions about your discharge instructions: Diet   Yes.   Medications  Yes.   Follow up visit  Yes.    Do you have questions or concerns about your Care? No.  Actions: * If pain score is 4 or above: No action needed, pain <4.

## 2024-10-03 ENCOUNTER — Ambulatory Visit: Payer: Self-pay | Admitting: Internal Medicine

## 2024-10-03 LAB — SURGICAL PATHOLOGY

## 2024-10-11 ENCOUNTER — Other Ambulatory Visit (HOSPITAL_COMMUNITY): Payer: Self-pay

## 2024-10-11 MED ORDER — OZEMPIC (1 MG/DOSE) 4 MG/3ML ~~LOC~~ SOPN
1.0000 mg | PEN_INJECTOR | SUBCUTANEOUS | 1 refills | Status: AC
  Filled 2024-10-11: qty 9, 84d supply, fill #0

## 2024-10-11 MED ORDER — FLUTICASONE PROPIONATE 50 MCG/ACT NA SUSP
1.0000 | Freq: Every day | NASAL | 1 refills | Status: AC
  Filled 2024-10-11: qty 48, 90d supply, fill #0
